# Patient Record
Sex: Female | Born: 1960 | Race: Black or African American | Hispanic: No | Marital: Single | Smoking: Current every day smoker
Health system: Southern US, Community
[De-identification: ages and names within clinical notes are randomized; demographics above are authoritative.]

## PROBLEM LIST (undated history)

## (undated) DIAGNOSIS — J45909 Unspecified asthma, uncomplicated: Secondary | ICD-10-CM

## (undated) DIAGNOSIS — I10 Essential (primary) hypertension: Secondary | ICD-10-CM

## (undated) DIAGNOSIS — E785 Hyperlipidemia, unspecified: Secondary | ICD-10-CM

## (undated) HISTORY — DX: Hyperlipidemia, unspecified: E78.5

## (undated) HISTORY — DX: Unspecified asthma, uncomplicated: J45.909

## (undated) HISTORY — DX: Essential (primary) hypertension: I10

## (undated) HISTORY — PX: NO PAST SURGERIES: SHX2092

---

## 2004-07-14 ENCOUNTER — Other Ambulatory Visit: Admission: RE | Admit: 2004-07-14 | Discharge: 2004-07-14 | Payer: Self-pay | Admitting: Family Medicine

## 2004-08-28 ENCOUNTER — Other Ambulatory Visit: Admission: RE | Admit: 2004-08-28 | Discharge: 2004-08-28 | Payer: Self-pay | Admitting: Family Medicine

## 2005-07-20 ENCOUNTER — Other Ambulatory Visit: Admission: RE | Admit: 2005-07-20 | Discharge: 2005-07-20 | Payer: Self-pay

## 2007-07-18 ENCOUNTER — Ambulatory Visit (HOSPITAL_COMMUNITY): Admission: RE | Admit: 2007-07-18 | Discharge: 2007-07-18 | Payer: Self-pay | Admitting: Family Medicine

## 2008-07-19 ENCOUNTER — Ambulatory Visit (HOSPITAL_COMMUNITY): Admission: RE | Admit: 2008-07-19 | Discharge: 2008-07-19 | Payer: Self-pay | Admitting: Family Medicine

## 2008-09-17 ENCOUNTER — Other Ambulatory Visit: Admission: RE | Admit: 2008-09-17 | Discharge: 2008-09-17 | Payer: Self-pay | Admitting: Unknown Physician Specialty

## 2009-07-22 ENCOUNTER — Ambulatory Visit (HOSPITAL_COMMUNITY): Admission: RE | Admit: 2009-07-22 | Discharge: 2009-07-22 | Payer: Self-pay | Admitting: Nurse Practitioner

## 2010-07-27 ENCOUNTER — Ambulatory Visit (HOSPITAL_COMMUNITY): Admission: RE | Admit: 2010-07-27 | Discharge: 2010-07-27 | Payer: Self-pay | Admitting: Family Medicine

## 2011-08-02 ENCOUNTER — Other Ambulatory Visit (HOSPITAL_COMMUNITY): Payer: Self-pay | Admitting: Family Medicine

## 2011-08-02 DIAGNOSIS — Z139 Encounter for screening, unspecified: Secondary | ICD-10-CM

## 2011-08-09 ENCOUNTER — Ambulatory Visit (HOSPITAL_COMMUNITY)
Admission: RE | Admit: 2011-08-09 | Discharge: 2011-08-09 | Disposition: A | Discharge status: Home / Self Care | Payer: Self-pay | Source: Ambulatory Visit | Attending: Family Medicine | Admitting: Family Medicine

## 2011-08-09 DIAGNOSIS — Z1231 Encounter for screening mammogram for malignant neoplasm of breast: Secondary | ICD-10-CM | POA: Insufficient documentation

## 2011-08-09 DIAGNOSIS — Z139 Encounter for screening, unspecified: Secondary | ICD-10-CM

## 2012-08-09 ENCOUNTER — Other Ambulatory Visit (HOSPITAL_COMMUNITY): Payer: Self-pay | Admitting: *Deleted

## 2012-08-09 DIAGNOSIS — Z139 Encounter for screening, unspecified: Secondary | ICD-10-CM

## 2012-08-17 ENCOUNTER — Ambulatory Visit (HOSPITAL_COMMUNITY)
Admission: RE | Admit: 2012-08-17 | Discharge: 2012-08-17 | Disposition: A | Discharge status: Home / Self Care | Payer: PRIVATE HEALTH INSURANCE | Source: Ambulatory Visit | Attending: *Deleted | Admitting: *Deleted

## 2012-08-17 DIAGNOSIS — Z139 Encounter for screening, unspecified: Secondary | ICD-10-CM

## 2012-08-17 DIAGNOSIS — Z1231 Encounter for screening mammogram for malignant neoplasm of breast: Secondary | ICD-10-CM | POA: Insufficient documentation

## 2013-03-23 ENCOUNTER — Ambulatory Visit: Payer: Self-pay | Admitting: Nurse Practitioner

## 2013-03-23 ENCOUNTER — Telehealth: Payer: Self-pay | Admitting: Physician Assistant

## 2013-03-23 NOTE — Telephone Encounter (Signed)
APPT MADE

## 2013-03-26 ENCOUNTER — Ambulatory Visit (INDEPENDENT_AMBULATORY_CARE_PROVIDER_SITE_OTHER): Payer: BC Managed Care – PPO | Admitting: General Practice

## 2013-03-26 ENCOUNTER — Telehealth: Payer: Self-pay | Admitting: Physician Assistant

## 2013-03-26 ENCOUNTER — Encounter: Payer: Self-pay | Admitting: General Practice

## 2013-03-26 VITALS — BP 164/86 | HR 79 | Temp 97.3°F | Ht 69.0 in | Wt 280.0 lb

## 2013-03-26 DIAGNOSIS — J322 Chronic ethmoidal sinusitis: Secondary | ICD-10-CM | POA: Insufficient documentation

## 2013-03-26 DIAGNOSIS — J069 Acute upper respiratory infection, unspecified: Secondary | ICD-10-CM | POA: Insufficient documentation

## 2013-03-26 DIAGNOSIS — R05 Cough: Secondary | ICD-10-CM

## 2013-03-26 DIAGNOSIS — J309 Allergic rhinitis, unspecified: Secondary | ICD-10-CM

## 2013-03-26 MED ORDER — AZITHROMYCIN 250 MG PO TABS: ORAL_TABLET | ORAL | Status: DC

## 2013-03-26 MED ORDER — CETIRIZINE HCL 10 MG PO TABS: 10.0000 mg | ORAL_TABLET | Freq: Every day | ORAL | Status: DC

## 2013-03-26 MED ORDER — GUAIFENESIN-CODEINE 100-10 MG/5ML PO SYRP: 5.0000 mL | ORAL_SOLUTION | Freq: Three times a day (TID) | ORAL | Status: DC | PRN

## 2013-03-26 MED ORDER — GUAIFENESIN-CODEINE 100-10 MG/5ML PO SYRP: 5.0000 mL | ORAL_SOLUTION | Freq: Every evening | ORAL | Status: DC | PRN

## 2013-03-26 NOTE — Progress Notes (Signed)
  Subjective:    Patient ID: Lauren Gardner, female    DOB: Dec 18, 1961, 52 y.o.   MRN: [id number]  Cough This is a new problem. The current episode started in the past 7 days. The problem has been gradually worsening. The problem occurs hourly. Cough characteristics: greensih color. Associated symptoms include headaches, nasal congestion, postnasal drip and rhinorrhea. Pertinent negatives include no chest pain, fever, rash or sore throat. Exacerbated by: climate "hot" She has tried prescription cough suppressant for the symptoms. There is no history of asthma or COPD.   Reports sinus pressure between eyes.   Review of Systems  Constitutional: Negative for fever.  HENT: Positive for rhinorrhea and postnasal drip. Negative for sore throat.   Respiratory: Positive for cough.   Cardiovascular: Negative for chest pain.  Skin: Negative for rash.  Neurological: Positive for headaches.       Objective:   Physical Exam  Constitutional: She is oriented to person, place, and time. She appears well-developed and well-nourished.  HENT:  Head: Normocephalic and atraumatic.  Right Ear: External ear normal.  Left Ear: External ear normal.  Nose: Rhinorrhea present. Right sinus exhibits maxillary sinus tenderness. Left sinus exhibits maxillary sinus tenderness.  Mouth/Throat: Posterior oropharyngeal erythema present.  Watery eyes, clear discharge sneezing  Eyes: Right eye exhibits no discharge.  Cardiovascular: Normal rate, regular rhythm and normal heart sounds.   No murmur heard. Pulmonary/Chest: Effort normal. No respiratory distress. She exhibits no tenderness.  Patient coughing, dry non productive   Neurological: She is alert and oriented to person, place, and time.  Skin: Skin is warm and dry.  Psychiatric: She has a normal mood and affect.          Assessment & Plan:  Continue antibiotics even if feeling better Increase fluid intake Motrin or tylenol OTC Throat lozenges if  help New toothbrush in 3 days Proper handwashing   Raymon Mutton, FNP-C

## 2013-03-26 NOTE — Telephone Encounter (Signed)
SEE ORIG. ENCOUNTER.

## 2013-03-26 NOTE — Telephone Encounter (Signed)
appt made for today 

## 2013-03-26 NOTE — Telephone Encounter (Signed)
PT WANTS MEDS CALLED INTO [facility] FOR SINUS INFECTION- CHART IN ROUTE.-JHB

## 2013-03-26 NOTE — Patient Instructions (Addendum)
Upper Respiratory Infection, Adult An upper respiratory infection (URI) is also sometimes known as the common cold. The upper respiratory tract includes the nose, sinuses, throat, trachea, and bronchi. Bronchi are the airways leading to the lungs. Most people improve within 1 week, but symptoms can last up to 2 weeks. A residual cough may last even longer.  CAUSES Many different viruses can infect the tissues lining the upper respiratory tract. The tissues become irritated and inflamed and often become very moist. Mucus production is also common. A cold is contagious. You can easily spread the virus to others by oral contact. This includes kissing, sharing a glass, coughing, or sneezing. Touching your mouth or nose and then touching a surface, which is then touched by another person, can also spread the virus. SYMPTOMS  Symptoms typically develop 1 to 3 days after you come in contact with a cold virus. Symptoms vary from person to person. They may include:  Runny nose.  Sneezing.  Nasal congestion.  Sinus irritation.  Sore throat.  Loss of voice (laryngitis).  Cough.  Fatigue.  Muscle aches.  Loss of appetite.  Headache.  Low-grade fever. DIAGNOSIS  You might diagnose your own cold based on familiar symptoms, since most people get a cold 2 to 3 times a year. Your caregiver can confirm this based on your exam. Most importantly, your caregiver can check that your symptoms are not due to another disease such as strep throat, sinusitis, pneumonia, asthma, or epiglottitis. Blood tests, throat tests, and X-rays are not necessary to diagnose a common cold, but they may sometimes be helpful in excluding other more serious diseases. Your caregiver will decide if any further tests are required. RISKS AND COMPLICATIONS  You may be at risk for a more severe case of the common cold if you smoke cigarettes, have chronic heart disease (such as heart failure) or lung disease (such as asthma), or if  you have a weakened immune system. The very young and very old are also at risk for more serious infections. Bacterial sinusitis, middle ear infections, and bacterial pneumonia can complicate the common cold. The common cold can worsen asthma and chronic obstructive pulmonary disease (COPD). Sometimes, these complications can require emergency medical care and may be life-threatening. PREVENTION  The best way to protect against getting a cold is to practice good hygiene. Avoid oral or hand contact with people with cold symptoms. Wash your hands often if contact occurs. There is no clear evidence that vitamin C, vitamin E, echinacea, or exercise reduces the chance of developing a cold. However, it is always recommended to get plenty of rest and practice good nutrition. TREATMENT  Treatment is directed at relieving symptoms. There is no cure. Antibiotics are not effective, because the infection is caused by a virus, not by bacteria. Treatment may include:  Increased fluid intake. Sports drinks offer valuable electrolytes, sugars, and fluids.  Breathing heated mist or steam (vaporizer or shower).  Eating chicken soup or other clear broths, and maintaining good nutrition.  Getting plenty of rest.  Using gargles or lozenges for comfort.  Controlling fevers with ibuprofen or acetaminophen as directed by your caregiver.  Increasing usage of your inhaler if you have asthma. Zinc gel and zinc lozenges, taken in the first 24 hours of the common cold, can shorten the duration and lessen the severity of symptoms. Pain medicines may help with fever, muscle aches, and throat pain. A variety of non-prescription medicines are available to treat congestion and runny nose. Your caregiver   duration and lessen the severity of symptoms. Pain medicines may help with fever, muscle aches, and throat pain. A variety of non-prescription medicines are available to treat congestion and runny nose. Your caregiver can make recommendations and may suggest nasal or lung inhalers for other symptoms.   HOME CARE INSTRUCTIONS    Only take over-the-counter or prescription medicines for pain, discomfort, or fever as directed by your  caregiver.   Use a warm mist humidifier or inhale steam from a shower to increase air moisture. This may keep secretions moist and make it easier to breathe.   Drink enough water and fluids to keep your urine clear or pale yellow.   Rest as needed.   Return to work when your temperature has returned to normal or as your caregiver advises. You may need to stay home longer to avoid infecting others. You can also use a face mask and careful hand washing to prevent spread of the virus.  SEEK MEDICAL CARE IF:    After the first few days, you feel you are getting worse rather than better.   You need your caregiver's advice about medicines to control symptoms.   You develop chills, worsening shortness of breath, or brown or red sputum. These may be signs of pneumonia.   You develop yellow or brown nasal discharge or pain in the face, especially when you bend forward. These may be signs of sinusitis.   You develop a fever, swollen neck glands, pain with swallowing, or white areas in the back of your throat. These may be signs of strep throat.  SEEK IMMEDIATE MEDICAL CARE IF:    You have a fever.   You develop severe or persistent headache, ear pain, sinus pain, or chest pain.   You develop wheezing, a prolonged cough, cough up blood, or have a change in your usual mucus (if you have chronic lung disease).   You develop sore muscles or a stiff neck.  Document Released: 06/01/2001 Document Revised: 02/28/2012 Document Reviewed: 04/09/2011  ExitCare Patient Information 2013 [employer].  Sinusitis  Sinusitis is redness, soreness, and swelling (inflammation) of the paranasal sinuses. Paranasal sinuses are air pockets within the bones of your face (beneath the eyes, the middle of the forehead, or above the eyes). In healthy paranasal sinuses, mucus is able to drain out, and air is able to circulate through them by way of your nose. However, when your paranasal sinuses are inflamed, mucus and air can become  trapped. This can allow bacteria and other germs to grow and cause infection.  Sinusitis can develop quickly and last only a short time (acute) or continue over a long period (chronic). Sinusitis that lasts for more than 12 weeks is considered chronic.   CAUSES   Causes of sinusitis include:   Allergies.   Structural abnormalities, such as displacement of the cartilage that separates your nostrils (deviated septum), which can decrease the air flow through your nose and sinuses and affect sinus drainage.   Functional abnormalities, such as when the small hairs (cilia) that line your sinuses and help remove mucus do not work properly or are not present.  SYMPTOMS   Symptoms of acute and chronic sinusitis are the same. The primary symptoms are pain and pressure around the affected sinuses. Other symptoms include:   Upper toothache.   Earache.   Headache.   Bad breath.   Decreased sense of smell and taste.   A cough, which worsens when you are lying flat.   Fatigue.     Fever.   Thick drainage from your nose, which often is green and may contain pus (purulent).   Swelling and warmth over the affected sinuses.  DIAGNOSIS   Your caregiver will perform a physical exam. During the exam, your caregiver may:   Look in your nose for signs of abnormal growths in your nostrils (nasal polyps).   Tap over the affected sinus to check for signs of infection.   View the inside of your sinuses (endoscopy) with a special imaging device with a light attached (endoscope), which is inserted into your sinuses.  If your caregiver suspects that you have chronic sinusitis, one or more of the following tests may be recommended:   Allergy tests.   Nasal culture A sample of mucus is taken from your nose and sent to a lab and screened for bacteria.   Nasal cytology A sample of mucus is taken from your nose and examined by your caregiver to determine if your sinusitis is related to an allergy.  TREATMENT   Most cases of acute  sinusitis are related to a viral infection and will resolve on their own within 10 days. Sometimes medicines are prescribed to help relieve symptoms (pain medicine, decongestants, nasal steroid sprays, or saline sprays).   However, for sinusitis related to a bacterial infection, your caregiver will prescribe antibiotic medicines. These are medicines that will help kill the bacteria causing the infection.   Rarely, sinusitis is caused by a fungal infection. In theses cases, your caregiver will prescribe antifungal medicine.  For some cases of chronic sinusitis, surgery is needed. Generally, these are cases in which sinusitis recurs more than 3 times per year, despite other treatments.  HOME CARE INSTRUCTIONS    Drink plenty of water. Water helps thin the mucus so your sinuses can drain more easily.   Use a humidifier.   Inhale steam 3 to 4 times a day (for example, sit in the bathroom with the shower running).   Apply a warm, moist washcloth to your face 3 to 4 times a day, or as directed by your caregiver.   Use saline nasal sprays to help moisten and clean your sinuses.   Take over-the-counter or prescription medicines for pain, discomfort, or fever only as directed by your caregiver.  SEEK IMMEDIATE MEDICAL CARE IF:   You have increasing pain or severe headaches.   You have nausea, vomiting, or drowsiness.   You have swelling around your face.   You have vision problems.   You have a stiff neck.   You have difficulty breathing.  MAKE SURE YOU:    Understand these instructions.   Will watch your condition.   Will get help right away if you are not doing well or get worse.  Document Released: 12/06/2005 Document Revised: 02/28/2012 Document Reviewed: 12/21/2011  [employer] Patient Information 2013 [employer].

## 2013-03-30 ENCOUNTER — Telehealth: Payer: Self-pay | Admitting: Nurse Practitioner

## 2013-04-02 NOTE — Telephone Encounter (Signed)
Pt aware.

## 2013-04-02 NOTE — Telephone Encounter (Signed)
Patient called stating that she still has not got the script from last week.

## 2013-04-02 NOTE — Telephone Encounter (Signed)
Does patient need something with codeine

## 2013-04-02 NOTE — Telephone Encounter (Signed)
JUST SOMETHING THAT WILL HELP WITH THE COUGH AND DRAINAGE

## 2013-04-02 NOTE — Telephone Encounter (Signed)
Mucinex D OTC will work best for that.

## 2013-07-06 ENCOUNTER — Encounter: Payer: Self-pay | Admitting: General Practice

## 2013-07-06 ENCOUNTER — Telehealth: Payer: Self-pay | Admitting: Physician Assistant

## 2013-07-06 ENCOUNTER — Encounter: Payer: BC Managed Care – PPO | Admitting: General Practice

## 2013-07-06 NOTE — Telephone Encounter (Signed)
appt made for today 

## 2013-07-06 NOTE — Progress Notes (Signed)
Patient ID: Lauren Gardner, female   DOB: 08-Aug-1961, 52 y.o.   MRN: [id number]  Patient reschedule for tomorrow.

## 2013-07-07 ENCOUNTER — Ambulatory Visit (INDEPENDENT_AMBULATORY_CARE_PROVIDER_SITE_OTHER): Payer: BC Managed Care – PPO | Admitting: Family Medicine

## 2013-07-07 VITALS — BP 109/75 | HR 93 | Temp 97.7°F | Wt 284.0 lb

## 2013-07-07 DIAGNOSIS — H1045 Other chronic allergic conjunctivitis: Secondary | ICD-10-CM

## 2013-07-07 DIAGNOSIS — H1010 Acute atopic conjunctivitis, unspecified eye: Secondary | ICD-10-CM | POA: Insufficient documentation

## 2013-07-07 DIAGNOSIS — E785 Hyperlipidemia, unspecified: Secondary | ICD-10-CM

## 2013-07-07 DIAGNOSIS — J329 Chronic sinusitis, unspecified: Secondary | ICD-10-CM | POA: Insufficient documentation

## 2013-07-07 DIAGNOSIS — J302 Other seasonal allergic rhinitis: Secondary | ICD-10-CM | POA: Insufficient documentation

## 2013-07-07 DIAGNOSIS — J309 Allergic rhinitis, unspecified: Secondary | ICD-10-CM

## 2013-07-07 MED ORDER — MOMETASONE FUROATE 50 MCG/ACT NA SUSP: 2.0000 | Freq: Every day | NASAL | Status: DC

## 2013-07-07 MED ORDER — CETIRIZINE HCL 10 MG PO TABS: 10.0000 mg | ORAL_TABLET | Freq: Every day | ORAL | Status: DC

## 2013-07-07 MED ORDER — AMOXICILLIN 875 MG PO TABS: 875.0000 mg | ORAL_TABLET | Freq: Two times a day (BID) | ORAL | Status: DC

## 2013-07-07 NOTE — Progress Notes (Signed)
Patient ID: Lauren Gardner, female   DOB: 06/17/1961, 52 y.o.   MRN: [id number] SUBJECTIVE: CC: Chief Complaint  Patient presents with  . Acute Visit    allergies stuffy nose cough     HPI: Has allergies and needs generic. Coughs at times. Facial pressure. Thinks  She needs an antibiotic.  Past Medical History  Diagnosis Date  . Asthma   . Hyperlipidemia   . Hypertension    No past surgical history on file. History   Social History  . Marital Status: Single    Spouse Name: N/A    Number of Children: N/A  . Years of Education: N/A   Occupational History  . Not on file.   Social History Main Topics  . Smoking status: Light Tobacco Smoker  . Smokeless tobacco: Not on file  . Alcohol Use: No  . Drug Use: No  . Sexually Active: Not on file   Other Topics Concern  . Not on file   Social History Narrative  . No narrative on file   Family History  Problem Relation Age of Onset  . Hyperlipidemia Mother   . Cancer Sister   . Hypertension Sister    Current Outpatient Prescriptions on File Prior to Visit  Medication Sig Dispense Refill  . hydrochlorothiazide (HYDRODIURIL) 25 MG tablet Take 25 mg by mouth daily.      . Norethindrone (ORTHO MICRONOR PO) Take by mouth.      . pravastatin (PRAVACHOL) 20 MG tablet Take 20 mg by mouth daily.       No current facility-administered medications on file prior to visit.   Allergies  Allergen Reactions  . Prednisolone     There is no immunization history on file for this patient. Prior to Admission medications   Medication Sig Start Date End Date Taking? Authorizing Provider  hydrochlorothiazide (HYDRODIURIL) 25 MG tablet Take 25 mg by mouth daily.   Yes Historical Provider, MD  Norethindrone (ORTHO MICRONOR PO) Take by mouth.   Yes Historical Provider, MD  pravastatin (PRAVACHOL) 20 MG tablet Take 20 mg by mouth daily.   Yes Historical Provider, MD  amoxicillin (AMOXIL) 875 MG tablet Take 1 tablet (875 mg total) by mouth  2 (two) times daily. 07/07/13   Ileana Ladd, MD  cetirizine (ZYRTEC) 10 MG tablet Take 1 tablet (10 mg total) by mouth daily. 07/07/13   Ileana Ladd, MD  mometasone (NASONEX) 50 MCG/ACT nasal spray Place 2 sprays into the nose daily. 07/07/13   Ileana Ladd, MD     ROS: As above in the HPI. All other systems are stable or negative.  OBJECTIVE: APPEARANCE:  Patient in no acute distress.The patient appeared well nourished and normally developed. Acyanotic. Waist:48 inches  VITAL SIGNS:BP 109/75  Pulse 93  Temp(Src) 97.7 F (36.5 C) (Oral)  Wt 284 lb (128.822 kg)  BMI 41.92 kg/m2 Obese AAF  SKIN: warm and  Dry without overt rashes, tattoos and scars  HEAD and Neck: without JVD, Head and scalp: normal Eyes:No scleral icterus. Fundi normal, eye movements normal.left upper eyelid has a xanthelasma plaque Ears: Auricle normal, canal normal, Tympanic membranes normal, insufflation normal. Nose: nasal congestion and paranasal pressure,  Throat: normal Neck & thyroid: normal  CHEST & LUNGS: Chest wall: normal Lungs: Clear  [facility]: Reveals the PMI to be normally located. Regular rhythm, First and Second Heart sounds are normal,  absence of murmurs, rubs or gallops. Peripheral vasculature: Radial pulses: normal Dorsal pedis pulses: normal Posterior pulses: normal  ABDOMEN:  Appearance: ob Benign, no organomegaly, no masses, no Abdominal Aortic enlargement. No Guarding , no rebound. No Bruits. Bowel sounds: normal   ASSESSMENT: Seasonal allergic conjunctivitis  Seasonal allergic rhinitis - Plan: mometasone (NASONEX) 50 MCG/ACT nasal spray, cetirizine (ZYRTEC) 10 MG tablet  Sinusitis nasal - Plan: mometasone (NASONEX) 50 MCG/ACT nasal spray, amoxicillin (AMOXIL) 875 MG tablet, cetirizine (ZYRTEC) 10 MG tablet  Severe obesity (BMI >= 40)  HLD (hyperlipidemia)  PLAN:      Dr Woodroe Mode Recommendations  Diet and Exercise discussed with patient.  For  nutrition information, I recommend books:  1).Eat to Live by Dr Monico Hoar. 2).Prevent and Reverse Heart Disease by Dr Suzzette Righter. 3) Dr Katherina Right Book:  Program to Reverse Diabetes  Exercise recommendations are:  If unable to walk, then the patient can exercise in a chair 3 times a day. By flapping arms like a bird gently and raising legs outwards to the front.  If ambulatory, the patient can go for walks for 30 minutes 3 times a week. Then increase the intensity and duration as tolerated.  Goal is to try to attain exercise frequency to 5 times a week.  If applicable: Best to perform resistance exercises (machines or weights) 2 days a week and cardio type exercises 3 days per week.  Meds ordered this encounter  Medications  . mometasone (NASONEX) 50 MCG/ACT nasal spray    Sig: Place 2 sprays into the nose daily.    Dispense:  17 g    Refill:  3  . amoxicillin (AMOXIL) 875 MG tablet    Sig: Take 1 tablet (875 mg total) by mouth 2 (two) times daily.    Dispense:  20 tablet    Refill:  0  . cetirizine (ZYRTEC) 10 MG tablet    Sig: Take 1 tablet (10 mg total) by mouth daily.    Dispense:  30 tablet    Refill:  5   Return if symptoms worsen or fail to improve.  [name], M.D.

## 2013-07-07 NOTE — Patient Instructions (Addendum)
      Dr Dempsey Ahonen's Recommendations  Diet and Exercise discussed with patient.  For nutrition information, I recommend books:  1).Eat to Live by Dr Joel Fuhrman. 2).Prevent and Reverse Heart Disease by Dr Caldwell Esselstyn. 3) Dr Neal Barnard's Book:  Program to Reverse Diabetes  Exercise recommendations are:  If unable to walk, then the patient can exercise in a chair 3 times a day. By flapping arms like a bird gently and raising legs outwards to the front.  If ambulatory, the patient can go for walks for 30 minutes 3 times a week. Then increase the intensity and duration as tolerated.  Goal is to try to attain exercise frequency to 5 times a week.  If applicable: Best to perform resistance exercises (machines or weights) 2 days a week and cardio type exercises 3 days per week.  

## 2013-07-21 ENCOUNTER — Telehealth: Payer: Self-pay | Admitting: Family Medicine

## 2013-07-23 ENCOUNTER — Other Ambulatory Visit: Payer: Self-pay | Admitting: Family Medicine

## 2013-07-23 DIAGNOSIS — B3731 Acute candidiasis of vulva and vagina: Secondary | ICD-10-CM

## 2013-07-23 DIAGNOSIS — B373 Candidiasis of vulva and vagina: Secondary | ICD-10-CM

## 2013-07-23 MED ORDER — FLUCONAZOLE 150 MG PO TABS: 150.0000 mg | ORAL_TABLET | Freq: Once | ORAL | Status: DC

## 2013-07-23 NOTE — Telephone Encounter (Signed)
Pt aware rx called in.  

## 2013-07-23 NOTE — Telephone Encounter (Signed)
Done in EPIC. Diflucan Rx.

## 2013-07-31 ENCOUNTER — Other Ambulatory Visit: Payer: Self-pay | Admitting: Nurse Practitioner

## 2013-07-31 DIAGNOSIS — Z139 Encounter for screening, unspecified: Secondary | ICD-10-CM

## 2013-08-13 ENCOUNTER — Telehealth: Payer: Self-pay | Admitting: Nurse Practitioner

## 2013-08-14 ENCOUNTER — Telehealth: Payer: Self-pay | Admitting: Family Medicine

## 2013-08-15 MED ORDER — HYDROCHLOROTHIAZIDE 25 MG PO TABS: 25.0000 mg | ORAL_TABLET | Freq: Every day | ORAL | Status: DC

## 2013-08-15 NOTE — Telephone Encounter (Signed)
done

## 2013-08-17 MED ORDER — NORETHINDRONE 0.35 MG PO TABS: 1.0000 | ORAL_TABLET | Freq: Every day | ORAL | Status: DC

## 2013-08-17 NOTE — Telephone Encounter (Signed)
done

## 2013-08-21 ENCOUNTER — Encounter: Payer: Self-pay | Admitting: *Deleted

## 2013-08-21 ENCOUNTER — Telehealth: Payer: Self-pay | Admitting: Family Medicine

## 2013-08-21 NOTE — Telephone Encounter (Signed)
Spoke with pt she was concerned about her birth control med not being the same. Pt reasasured that the med that ordered on 08-17-13 notethidone is the same as orthor micronette. Confirmed by [employer]

## 2013-08-23 ENCOUNTER — Ambulatory Visit (HOSPITAL_COMMUNITY)
Admission: RE | Admit: 2013-08-23 | Discharge: 2013-08-23 | Disposition: A | Discharge status: Home / Self Care | Payer: BC Managed Care – PPO | Source: Ambulatory Visit | Attending: Nurse Practitioner | Admitting: Nurse Practitioner

## 2013-08-23 DIAGNOSIS — Z1231 Encounter for screening mammogram for malignant neoplasm of breast: Secondary | ICD-10-CM | POA: Insufficient documentation

## 2013-08-23 DIAGNOSIS — Z139 Encounter for screening, unspecified: Secondary | ICD-10-CM

## 2013-08-27 ENCOUNTER — Encounter: Payer: Self-pay | Admitting: General Practice

## 2013-08-27 ENCOUNTER — Ambulatory Visit (INDEPENDENT_AMBULATORY_CARE_PROVIDER_SITE_OTHER): Payer: BC Managed Care – PPO | Admitting: General Practice

## 2013-08-27 VITALS — BP 116/76 | HR 79 | Temp 98.9°F | Ht 67.0 in | Wt 294.0 lb

## 2013-08-27 DIAGNOSIS — I10 Essential (primary) hypertension: Secondary | ICD-10-CM

## 2013-08-27 DIAGNOSIS — J302 Other seasonal allergic rhinitis: Secondary | ICD-10-CM

## 2013-08-27 DIAGNOSIS — Z0000 Encounter for general adult medical examination without abnormal findings: Secondary | ICD-10-CM

## 2013-08-27 DIAGNOSIS — Z124 Encounter for screening for malignant neoplasm of cervix: Secondary | ICD-10-CM

## 2013-08-27 DIAGNOSIS — Z309 Encounter for contraceptive management, unspecified: Secondary | ICD-10-CM

## 2013-08-27 DIAGNOSIS — J329 Chronic sinusitis, unspecified: Secondary | ICD-10-CM

## 2013-08-27 DIAGNOSIS — E785 Hyperlipidemia, unspecified: Secondary | ICD-10-CM

## 2013-08-27 DIAGNOSIS — IMO0001 Reserved for inherently not codable concepts without codable children: Secondary | ICD-10-CM

## 2013-08-27 DIAGNOSIS — M1990 Unspecified osteoarthritis, unspecified site: Secondary | ICD-10-CM

## 2013-08-27 LAB — POCT URINALYSIS DIPSTICK
Bilirubin, UA: NEGATIVE
Ketones, UA: NEGATIVE
Leukocytes, UA: NEGATIVE
Nitrite, UA: NEGATIVE
Spec Grav, UA: 1.025

## 2013-08-27 LAB — POCT UA - MICROSCOPIC ONLY
Casts, Ur, LPF, POC: NEGATIVE
Mucus, UA: NEGATIVE
Yeast, UA: NEGATIVE

## 2013-08-27 LAB — POCT CBC
HCT, POC: 40.1 % (ref 37.7–47.9)
Hemoglobin: 13.3 g/dL (ref 12.2–16.2)
MCV: 87.6 fL (ref 80–97)
POC Granulocyte: 6.8 (ref 2–6.9)
POC LYMPH PERCENT: 28.1 %L (ref 10–50)
RBC: 4.6 M/uL (ref 4.04–5.48)

## 2013-08-27 MED ORDER — PRAVASTATIN SODIUM 20 MG PO TABS: 20.0000 mg | ORAL_TABLET | Freq: Every day | ORAL | Status: DC

## 2013-08-27 MED ORDER — MELOXICAM 15 MG PO TABS
15.0000 mg | ORAL_TABLET | Freq: Every day | ORAL | Status: DC
Start: 2013-08-27 — End: 2014-09-24

## 2013-08-27 MED ORDER — NORETHINDRONE 0.35 MG PO TABS: 1.0000 | ORAL_TABLET | Freq: Every day | ORAL | Status: DC

## 2013-08-27 MED ORDER — MOMETASONE FUROATE 50 MCG/ACT NA SUSP: 2.0000 | Freq: Every day | NASAL | Status: DC

## 2013-08-27 MED ORDER — HYDROCHLOROTHIAZIDE 25 MG PO TABS: 25.0000 mg | ORAL_TABLET | Freq: Every day | ORAL | Status: DC

## 2013-08-27 NOTE — Patient Instructions (Signed)

## 2013-08-27 NOTE — Progress Notes (Signed)
Subjective:    Patient ID: Lauren Gardner, female    DOB: 05/08/1961, 52 y.o.   MRN: [id number]  HPI Patient presents today for pap and annual physical. Reports trying to eat healthy, but denies regular exercise.     Review of Systems  Constitutional: Negative for fever and chills.  HENT: Negative for neck pain and neck stiffness.   Respiratory: Negative for chest tightness and shortness of breath.   Cardiovascular: Negative for chest pain and palpitations.  Gastrointestinal: Negative for nausea, vomiting and abdominal pain.  Genitourinary: Negative for difficulty urinating.  Musculoskeletal: Positive for back pain.       Back pain periodically from arthritis and possible pinched nerve  Neurological: Negative for dizziness, weakness and headaches.       Objective:   Physical Exam  Constitutional: She is oriented to person, place, and time. She appears well-developed and well-nourished.  HENT:  Head: Normocephalic and atraumatic.  Right Ear: External ear normal.  Left Ear: External ear normal.  Mouth/Throat: Oropharynx is clear and moist.  Eyes: Conjunctivae and EOM are normal. Pupils are equal, round, and reactive to light.  Neck: Normal range of motion. Neck supple. No thyromegaly present.  Cardiovascular: Normal rate, regular rhythm, normal heart sounds and intact distal pulses.   Pulmonary/Chest: Effort normal and breath sounds normal. No respiratory distress. Right breast exhibits no inverted nipple, no mass, no nipple discharge, no skin change and no tenderness. Left breast exhibits no inverted nipple, no mass, no nipple discharge, no skin change and no tenderness. Breasts are symmetrical.  Abdominal: Soft. Bowel sounds are normal. She exhibits no distension.  Genitourinary: Vagina normal and uterus normal. No breast swelling, tenderness, discharge or bleeding. No labial fusion. There is no rash, tenderness, lesion or injury on the right labia. There is no rash, tenderness,  lesion or injury on the left labia. Uterus is not deviated, not enlarged, not fixed and not tender. Cervix exhibits no motion tenderness, no discharge and no friability. Right adnexum displays no mass, no tenderness and no fullness. Left adnexum displays no mass, no tenderness and no fullness. No erythema, tenderness or bleeding around the vagina. No foreign body around the vagina. No signs of injury around the vagina. No vaginal discharge found.  Musculoskeletal:  Back pain peridically  Lymphadenopathy:    She has no cervical adenopathy.  Neurological: She is alert and oriented to person, place, and time.  Skin: Skin is warm and dry.  Psychiatric: She has a normal mood and affect.          Assessment & Plan:  1. Annual physical exam - POCT urinalysis dipstick - POCT UA - Microscopic Only - POCT CBC - Pap IG w/ reflex to HPV when ASC-U  2. Hypertension - CMP14+EGFR - hydrochlorothiazide (HYDRODIURIL) 25 MG tablet; Take 1 tablet (25 mg total) by mouth daily.  Dispense: 30 tablet; Refill: 3  3. Other and unspecified hyperlipidemia - NMR, lipoprofile - pravastatin (PRAVACHOL) 20 MG tablet; Take 1 tablet (20 mg total) by mouth daily.  Dispense: 30 tablet; Refill: 3  4. Seasonal allergic rhinitis - mometasone (NASONEX) 50 MCG/ACT nasal spray; Place 2 sprays into the nose daily.  Dispense: 17 g; Refill: 3  5. Sinusitis nasal  6. Birth control - norethindrone (ORTHO MICRONOR) 0.35 MG tablet; Take 1 tablet (0.35 mg total) by mouth daily.  Dispense: 1 Package; Refill: 11  7. Osteoarthritis - meloxicam (MOBIC) 15 MG tablet; Take 1 tablet (15 mg total) by mouth daily.  Dispense: 30  tablet; Refill: 3 -Continue all current medications Labs pending RTO if symptoms worsen with back pain and will refer to ortho  F/u in 3 months and prn Discussed healthy diet and regular exercise -Patient verbalized understanding -Coralie Keens, FNP-C

## 2013-08-28 LAB — NMR, LIPOPROFILE
Cholesterol: 206 mg/dL — ABNORMAL HIGH (ref <200)
HDL Cholesterol by NMR: 43 mg/dL (ref >=40)
LDL Particle Number: 2191 nmol/L — ABNORMAL HIGH (ref <1000)
LDL Size: 20.2 nm — ABNORMAL LOW (ref >20.5)
LP-IR Score: 69 — ABNORMAL HIGH (ref <=45)
Small LDL Particle Number: 1401 nmol/L — ABNORMAL HIGH (ref <=527)

## 2013-08-28 LAB — CMP14+EGFR
ALT: 17 IU/L (ref 0–32)
AST: 15 IU/L (ref 0–40)
Albumin/Globulin Ratio: 1.8 (ref 1.1–2.5)
Albumin: 4.2 g/dL (ref 3.5–5.5)
CO2: 26 mmol/L (ref 18–29)
GFR calc Af Amer: 107 mL/min/{1.73_m2} (ref >59)
Globulin, Total: 2.4 g/dL (ref 1.5–4.5)
Glucose: 69 mg/dL (ref 65–99)
Total Protein: 6.6 g/dL (ref 6.0–8.5)

## 2013-08-29 LAB — PAP IG W/ RFLX HPV ASCU: PAP Smear Comment: 0

## 2013-08-30 ENCOUNTER — Telehealth: Payer: Self-pay | Admitting: General Practice

## 2013-08-30 NOTE — Telephone Encounter (Signed)
Patient aware.

## 2013-09-03 ENCOUNTER — Telehealth: Payer: Self-pay | Admitting: General Practice

## 2013-09-04 NOTE — Telephone Encounter (Signed)
Patient aware.

## 2013-09-18 ENCOUNTER — Other Ambulatory Visit: Payer: Self-pay | Admitting: Family Medicine

## 2013-09-25 ENCOUNTER — Other Ambulatory Visit: Payer: BC Managed Care – PPO | Admitting: Nurse Practitioner

## 2013-10-12 ENCOUNTER — Other Ambulatory Visit: Payer: Self-pay | Admitting: Family Medicine

## 2013-10-25 ENCOUNTER — Other Ambulatory Visit: Payer: Self-pay | Admitting: Family Medicine

## 2013-10-25 ENCOUNTER — Telehealth: Payer: Self-pay | Admitting: General Practice

## 2013-11-08 ENCOUNTER — Telehealth: Payer: Self-pay | Admitting: General Practice

## 2013-11-09 NOTE — Telephone Encounter (Signed)
Triage will schedule.

## 2013-11-09 NOTE — Telephone Encounter (Signed)
Needs to be seen for evaluation

## 2014-01-09 ENCOUNTER — Encounter: Payer: Self-pay | Admitting: General Practice

## 2014-01-09 ENCOUNTER — Ambulatory Visit (INDEPENDENT_AMBULATORY_CARE_PROVIDER_SITE_OTHER): Payer: BC Managed Care – PPO | Admitting: General Practice

## 2014-01-09 ENCOUNTER — Encounter (INDEPENDENT_AMBULATORY_CARE_PROVIDER_SITE_OTHER): Payer: Self-pay

## 2014-01-09 VITALS — BP 122/71 | HR 81 | Temp 97.8°F | Ht 67.0 in | Wt 312.0 lb

## 2014-01-09 DIAGNOSIS — R05 Cough: Secondary | ICD-10-CM

## 2014-01-09 DIAGNOSIS — R059 Cough, unspecified: Secondary | ICD-10-CM

## 2014-01-09 DIAGNOSIS — J019 Acute sinusitis, unspecified: Secondary | ICD-10-CM

## 2014-01-09 MED ORDER — BENZONATATE 100 MG PO CAPS: 100.0000 mg | ORAL_CAPSULE | Freq: Three times a day (TID) | ORAL | Status: DC | PRN

## 2014-01-09 MED ORDER — AZITHROMYCIN 250 MG PO TABS: ORAL_TABLET | ORAL | Status: DC

## 2014-01-09 NOTE — Progress Notes (Signed)
   Subjective:    Patient ID: Lauren Gardner, female    DOB: June 07, 1961, 53 y.o.   MRN: [id number]  Sinusitis This is a new problem. The current episode started in the past 7 days. The problem is unchanged. There has been no fever. Her pain is at a severity of 4/10. Associated symptoms include congestion, coughing, a hoarse voice and sinus pressure. Pertinent negatives include no chills, headaches, shortness of breath or sore throat. Past treatments include lying down. The treatment provided mild relief.  Cough This is a new problem. The current episode started in the past 7 days. The problem has been unchanged. The cough is non-productive. Pertinent negatives include no chest pain, chills, headaches, sore throat or shortness of breath. The symptoms are aggravated by lying down. Treatments tried: allergy medicine. There is no history of asthma, bronchitis or pneumonia.      Review of Systems  Constitutional: Negative for chills.  HENT: Positive for congestion, hoarse voice and sinus pressure. Negative for sore throat.   Respiratory: Positive for cough. Negative for chest tightness and shortness of breath.   Cardiovascular: Negative for chest pain and palpitations.  Neurological: Negative for dizziness, weakness and headaches.  All other systems reviewed and are negative.       Objective:   Physical Exam  Constitutional: She appears well-developed and well-nourished.  HENT:  Head: Normocephalic and atraumatic.  Nose: Right sinus exhibits maxillary sinus tenderness and frontal sinus tenderness. Left sinus exhibits maxillary sinus tenderness and frontal sinus tenderness.  Mouth/Throat: Posterior oropharyngeal erythema present.  Cardiovascular: Normal rate, regular rhythm and normal heart sounds.   No murmur heard. Pulmonary/Chest: Effort normal and breath sounds normal. No respiratory distress. She exhibits no tenderness.  Neurological: She is alert.  Skin: Skin is warm and dry. No  rash noted.  Psychiatric: She has a normal mood and affect.          Assessment & Plan:  1. Sinusitis, acute  - azithromycin (ZITHROMAX) 250 MG tablet; Take as directed  Dispense: 6 tablet; Refill: 0  2. Cough  - benzonatate (TESSALON) 100 MG capsule; Take 1 capsule (100 mg total) by mouth 3 (three) times daily as needed.  Dispense: 30 capsule; Refill: 0 -increase fluids -rest -RTO if symptoms worsen or unresolved Patient verbalized understanding Erby Pian, FNP-C

## 2014-01-09 NOTE — Patient Instructions (Signed)

## 2014-01-10 ENCOUNTER — Other Ambulatory Visit: Payer: Self-pay | Admitting: *Deleted

## 2014-01-10 DIAGNOSIS — E785 Hyperlipidemia, unspecified: Secondary | ICD-10-CM

## 2014-01-10 MED ORDER — PRAVASTATIN SODIUM 20 MG PO TABS: 20.0000 mg | ORAL_TABLET | Freq: Every day | ORAL | Status: DC

## 2014-01-17 ENCOUNTER — Other Ambulatory Visit: Payer: Self-pay | Admitting: General Practice

## 2014-01-17 ENCOUNTER — Telehealth: Payer: Self-pay | Admitting: General Practice

## 2014-01-17 DIAGNOSIS — R059 Cough, unspecified: Secondary | ICD-10-CM

## 2014-01-17 DIAGNOSIS — R05 Cough: Secondary | ICD-10-CM

## 2014-01-17 MED ORDER — GUAIFENESIN-CODEINE 100-10 MG/5ML PO SYRP: 5.0000 mL | ORAL_SOLUTION | Freq: Three times a day (TID) | ORAL | Status: DC | PRN

## 2014-01-17 NOTE — Telephone Encounter (Signed)
Please inform that script is ready for pick up.

## 2014-01-17 NOTE — Telephone Encounter (Signed)
Need something stronger for cough. Mae off today. Can you review please. Can you call to [place]?

## 2014-01-18 NOTE — Telephone Encounter (Signed)
Left message Rx is ready to be picked up

## 2014-03-07 ENCOUNTER — Other Ambulatory Visit: Payer: Self-pay | Admitting: *Deleted

## 2014-03-07 DIAGNOSIS — I10 Essential (primary) hypertension: Secondary | ICD-10-CM

## 2014-03-07 MED ORDER — HYDROCHLOROTHIAZIDE 25 MG PO TABS: 25.0000 mg | ORAL_TABLET | Freq: Every day | ORAL | Status: DC

## 2014-04-08 ENCOUNTER — Telehealth: Payer: Self-pay | Admitting: Nurse Practitioner

## 2014-04-08 NOTE — Telephone Encounter (Signed)
patient aware

## 2014-04-08 NOTE — Telephone Encounter (Signed)
mucine OTC and nettie pot

## 2014-04-09 ENCOUNTER — Other Ambulatory Visit: Payer: Self-pay | Admitting: General Practice

## 2014-04-12 ENCOUNTER — Encounter: Payer: Self-pay | Admitting: Family Medicine

## 2014-04-12 ENCOUNTER — Ambulatory Visit (INDEPENDENT_AMBULATORY_CARE_PROVIDER_SITE_OTHER): Payer: BC Managed Care – PPO | Admitting: Family Medicine

## 2014-04-12 VITALS — BP 114/69 | HR 77 | Temp 97.4°F | Ht 67.0 in | Wt 305.6 lb

## 2014-04-12 DIAGNOSIS — J329 Chronic sinusitis, unspecified: Secondary | ICD-10-CM

## 2014-04-12 MED ORDER — AMOXICILLIN 875 MG PO TABS: 875.0000 mg | ORAL_TABLET | Freq: Two times a day (BID) | ORAL | Status: DC

## 2014-04-12 MED ORDER — BENZONATATE 100 MG PO CAPS: 100.0000 mg | ORAL_CAPSULE | Freq: Three times a day (TID) | ORAL | Status: DC | PRN

## 2014-04-12 NOTE — Progress Notes (Signed)
   Subjective:    Patient ID: Lauren Gardner, female    DOB: 11-Dec-1961, 53 y.o.   MRN: [id number]  HPI  This 53 y.o. female presents for evaluation of URI sx's for over a week.  Review of Systems    No chest pain, SOB, HA, dizziness, vision change, N/V, diarrhea, constipation, dysuria, urinary urgency or frequency, myalgias, arthralgias or rash.  Objective:   Physical Exam  Vital signs noted  Well developed well nourished female.  HEENT - Head atraumatic Normocephalic                Eyes - PERRLA, Conjuctiva - clear Sclera- Clear EOMI                Ears - EAC's Wnl TM's Wnl Gross Hearing WNL                Nose - Nares patent                 Throat - oropharanx wnl Respiratory - Lungs CTA bilateral Cardiac - RRR S1 and S2 without murmur GI - Abdomen soft Nontender and bowel sounds active x 4 Extremities - No edema. Neuro - Grossly intact.      Assessment & Plan:  Sinusitis nasal - Plan: benzonatate (TESSALON PERLES) 100 MG capsule, amoxicillin (AMOXIL) 875 MG tablet Push po fluids, rest, tylenol and motrin otc prn as directed for fever, arthralgias, and myalgias.  Follow up prn if sx's continue or persist.  Lysbeth Penner FNP

## 2014-04-15 ENCOUNTER — Telehealth: Payer: Self-pay | Admitting: Family Medicine

## 2014-04-16 NOTE — Telephone Encounter (Signed)
Pt seen on 4/24 Given Amoxil and Tessalon Perles Wants stronger cough med  Please review and advise

## 2014-04-17 ENCOUNTER — Other Ambulatory Visit: Payer: Self-pay | Admitting: Family Medicine

## 2014-04-17 NOTE — Telephone Encounter (Signed)
NTBS for narcotic cough medicine

## 2014-04-18 ENCOUNTER — Telehealth: Payer: Self-pay | Admitting: Family Medicine

## 2014-04-18 DIAGNOSIS — B379 Candidiasis, unspecified: Secondary | ICD-10-CM

## 2014-04-18 NOTE — Telephone Encounter (Signed)
Left detailed message on voicemail that she NTBS for narcotic cough medicine so that we can evaluate the worsening of her symptoms. The original message didn't contain request for Diflucan. Bill isn't here to address this today.  She can use OTC generic Monistat. The 3 day ovule works well. She is to call back if she needs a prescription.

## 2014-04-19 MED ORDER — FLUCONAZOLE 150 MG PO TABS: 150.0000 mg | ORAL_TABLET | Freq: Once | ORAL | Status: DC

## 2014-04-19 NOTE — Telephone Encounter (Signed)
Spoke with pt Has yeast infection from antibiotic Rx for Diflucan ordered by Dietrich Pates Left message on pt's voicemail

## 2014-04-22 ENCOUNTER — Ambulatory Visit (INDEPENDENT_AMBULATORY_CARE_PROVIDER_SITE_OTHER): Payer: BC Managed Care – PPO | Admitting: Family

## 2014-04-22 ENCOUNTER — Telehealth: Payer: Self-pay | Admitting: Family Medicine

## 2014-04-22 ENCOUNTER — Encounter: Payer: Self-pay | Admitting: Family

## 2014-04-22 VITALS — BP 124/73 | HR 89 | Temp 99.3°F | Ht 68.0 in | Wt 308.2 lb

## 2014-04-22 DIAGNOSIS — J019 Acute sinusitis, unspecified: Secondary | ICD-10-CM

## 2014-04-22 MED ORDER — AMOXICILLIN-POT CLAVULANATE 875-125 MG PO TABS: 1.0000 | ORAL_TABLET | Freq: Two times a day (BID) | ORAL | Status: DC

## 2014-04-22 MED ORDER — HYDROCODONE-HOMATROPINE 5-1.5 MG/5ML PO SYRP: 5.0000 mL | ORAL_SOLUTION | Freq: Four times a day (QID) | ORAL | Status: DC | PRN

## 2014-04-22 NOTE — Telephone Encounter (Signed)
error 

## 2014-04-22 NOTE — Patient Instructions (Signed)

## 2014-04-22 NOTE — Progress Notes (Signed)
   Subjective:    Patient ID: Lauren Gardner, female    DOB: 1961/05/22, 53 y.o.   MRN: [id number]  Sinusitis This is a recurrent problem. The current episode started 1 to 4 weeks ago (two weeks ago). The problem has been gradually worsening since onset. There has been no fever. Her pain is at a severity of 10/10. The pain is moderate. Associated symptoms include congestion, sinus pressure and sneezing. Pertinent negatives include no sore throat. Past treatments include antibiotics, acetaminophen and oral decongestants. The treatment provided mild relief.      Review of Systems  HENT: Positive for congestion, sinus pressure and sneezing. Negative for sore throat.   All other systems reviewed and are negative.      Objective:   Physical Exam  Vitals reviewed. Constitutional: She is oriented to person, place, and time. She appears well-developed and well-nourished. No distress.  HENT:  Head: Normocephalic and atraumatic.  Right Ear: External ear normal.  Left Ear: External ear normal.  Mouth/Throat: Oropharynx is clear and moist.  Nasal passage erythema with mild swelling   Eyes: Pupils are equal, round, and reactive to light.  Neck: Normal range of motion. Neck supple. No thyromegaly present.  Cardiovascular: Normal rate, regular rhythm, normal heart sounds and intact distal pulses.   No murmur heard. Pulmonary/Chest: Effort normal. No respiratory distress. She has wheezes.  Musculoskeletal: Normal range of motion. She exhibits no edema and no tenderness.  Neurological: She is alert and oriented to person, place, and time.  Skin: Skin is warm and dry.  Psychiatric: She has a normal mood and affect. Her behavior is normal. Judgment and thought content normal.    BP 124/73  Pulse 89  Temp(Src) 99.3 F (37.4 C) (Oral)  Ht 5\' 8"  (1.727 m)  Wt 308 lb 3.2 oz (139.799 kg)  BMI 46.87 kg/m2       Assessment & Plan:  1. Acute sinusitis -- Take meds as prescribed - Use a cool  mist humidifier  -Use saline nose sprays frequently -Saline irrigations of the nose can be very helpful if done frequently.  * 4X daily for 1 week*  * Use of a nettie pot can be helpful with this. Follow directions with this* -Force fluids -For any cough or congestion  Use plain Mucinex- regular strength or max strength is fine -For fever or aces or pains- take tylenol or ibuprofen appropriate for age and weight.  * for fevers greater than 101 orally you may alternate ibuprofen and tylenol every  3 hours. -Throat lozenges if help -Cough syprup given- Educated pt that may cause drowsiness-Use Tessalon pearls during day and syrupy at bedtime -RTO prn- Call if not symptoms have not improved in 3 days and will call new antibotic     - amoxicillin-clavulanate (AUGMENTIN) 875-125 MG per tablet; Take 1 tablet by mouth 2 (two) times daily.  Dispense: 28 tablet; Refill: 0 - HYDROcodone-homatropine (HYCODAN) 5-1.5 MG/5ML syrup; Take 5 mLs by mouth every 6 (six) hours as needed for cough (May cause drowiness).  Dispense: 120 mL; Refill: 0  Evelina Dun, FNP

## 2014-05-12 ENCOUNTER — Other Ambulatory Visit: Payer: Self-pay | Admitting: *Deleted

## 2014-05-12 DIAGNOSIS — J329 Chronic sinusitis, unspecified: Secondary | ICD-10-CM

## 2014-05-12 DIAGNOSIS — J302 Other seasonal allergic rhinitis: Secondary | ICD-10-CM

## 2014-05-12 MED ORDER — CETIRIZINE HCL 10 MG PO TABS: 10.0000 mg | ORAL_TABLET | Freq: Every day | ORAL | Status: DC

## 2014-05-13 ENCOUNTER — Other Ambulatory Visit: Payer: Self-pay | Admitting: *Deleted

## 2014-05-13 DIAGNOSIS — E785 Hyperlipidemia, unspecified: Secondary | ICD-10-CM

## 2014-05-13 NOTE — Telephone Encounter (Signed)
No labs since 08/2013

## 2014-05-14 MED ORDER — PRAVASTATIN SODIUM 20 MG PO TABS: 20.0000 mg | ORAL_TABLET | Freq: Every day | ORAL | Status: DC

## 2014-05-14 NOTE — Telephone Encounter (Signed)
Patient NTBS for follow up and lab work  

## 2014-06-18 ENCOUNTER — Other Ambulatory Visit: Payer: Self-pay | Admitting: Nurse Practitioner

## 2014-06-19 NOTE — Telephone Encounter (Signed)
Last labs 08-27-13. Please advise

## 2014-06-19 NOTE — Telephone Encounter (Signed)
Patient NTBS for follow up and lab work  

## 2014-06-20 ENCOUNTER — Other Ambulatory Visit: Payer: Self-pay | Admitting: Nurse Practitioner

## 2014-06-24 NOTE — Telephone Encounter (Signed)
Last seen 04/22/14 C Hawks  Last lipids 08/27/13

## 2014-07-17 ENCOUNTER — Other Ambulatory Visit: Payer: Self-pay | Admitting: *Deleted

## 2014-07-17 MED ORDER — HYDROCHLOROTHIAZIDE 25 MG PO TABS: ORAL_TABLET | ORAL | Status: DC

## 2014-07-17 NOTE — Telephone Encounter (Signed)
Last ov 04/22/14

## 2014-08-16 ENCOUNTER — Other Ambulatory Visit: Payer: Self-pay | Admitting: Family

## 2014-08-19 ENCOUNTER — Other Ambulatory Visit: Payer: Self-pay | Admitting: Nurse Practitioner

## 2014-08-19 DIAGNOSIS — Z1231 Encounter for screening mammogram for malignant neoplasm of breast: Secondary | ICD-10-CM

## 2014-08-19 NOTE — Telephone Encounter (Signed)
Last lipids 9/14. Pt ntbs

## 2014-08-27 ENCOUNTER — Telehealth: Payer: Self-pay | Admitting: Family

## 2014-08-28 ENCOUNTER — Ambulatory Visit (HOSPITAL_COMMUNITY)
Admission: RE | Admit: 2014-08-28 | Discharge: 2014-08-28 | Disposition: A | Discharge status: Home / Self Care | Payer: BC Managed Care – PPO | Source: Ambulatory Visit | Attending: Nurse Practitioner | Admitting: Nurse Practitioner

## 2014-08-28 DIAGNOSIS — Z1231 Encounter for screening mammogram for malignant neoplasm of breast: Secondary | ICD-10-CM | POA: Insufficient documentation

## 2014-08-28 MED ORDER — PRAVASTATIN SODIUM 20 MG PO TABS: ORAL_TABLET | ORAL | Status: DC

## 2014-08-28 NOTE — Telephone Encounter (Signed)
RX refill sent to pharmacy.

## 2014-09-24 ENCOUNTER — Encounter: Payer: Self-pay | Admitting: *Deleted

## 2014-09-24 ENCOUNTER — Encounter: Payer: Self-pay | Admitting: Family

## 2014-09-24 ENCOUNTER — Ambulatory Visit (INDEPENDENT_AMBULATORY_CARE_PROVIDER_SITE_OTHER): Payer: BC Managed Care – PPO | Admitting: Family

## 2014-09-24 VITALS — BP 114/71 | HR 73 | Temp 97.3°F | Ht 68.0 in | Wt 290.6 lb

## 2014-09-24 DIAGNOSIS — E785 Hyperlipidemia, unspecified: Secondary | ICD-10-CM

## 2014-09-24 DIAGNOSIS — Z01419 Encounter for gynecological examination (general) (routine) without abnormal findings: Secondary | ICD-10-CM

## 2014-09-24 DIAGNOSIS — R319 Hematuria, unspecified: Secondary | ICD-10-CM

## 2014-09-24 DIAGNOSIS — I10 Essential (primary) hypertension: Secondary | ICD-10-CM | POA: Insufficient documentation

## 2014-09-24 DIAGNOSIS — M5440 Lumbago with sciatica, unspecified side: Secondary | ICD-10-CM

## 2014-09-24 DIAGNOSIS — J329 Chronic sinusitis, unspecified: Secondary | ICD-10-CM

## 2014-09-24 DIAGNOSIS — Z0000 Encounter for general adult medical examination without abnormal findings: Secondary | ICD-10-CM

## 2014-09-24 DIAGNOSIS — J302 Other seasonal allergic rhinitis: Secondary | ICD-10-CM

## 2014-09-24 DIAGNOSIS — N390 Urinary tract infection, site not specified: Secondary | ICD-10-CM

## 2014-09-24 MED ORDER — MELOXICAM 15 MG PO TABS: 15.0000 mg | ORAL_TABLET | Freq: Every day | ORAL | Status: DC

## 2014-09-24 MED ORDER — CETIRIZINE HCL 10 MG PO TABS: 10.0000 mg | ORAL_TABLET | Freq: Every day | ORAL | Status: DC

## 2014-09-24 MED ORDER — HYDROCHLOROTHIAZIDE 25 MG PO TABS: ORAL_TABLET | ORAL | Status: DC

## 2014-09-24 MED ORDER — PRAVASTATIN SODIUM 20 MG PO TABS: ORAL_TABLET | ORAL | Status: DC

## 2014-09-24 NOTE — Addendum Note (Signed)
Addended by: Earlene Plater on: 09/24/2014 04:52 PM   Modules accepted: Orders

## 2014-09-24 NOTE — Patient Instructions (Signed)

## 2014-09-24 NOTE — Progress Notes (Signed)
Subjective:    Patient ID: Lauren Gardner, female    DOB: 08-Nov-1961, 53 y.o.   MRN: [id number]  Pt presents to the office for annual with pap. She has the following chronic conditions: Hypertension This is a chronic problem. The current episode started more than 1 year ago. The problem has been resolved since onset. The problem is controlled. Pertinent negatives include no anxiety, headaches, palpitations, peripheral edema or shortness of breath. Risk factors for coronary artery disease include dyslipidemia, obesity and post-menopausal state. Past treatments include diuretics. The current treatment provides moderate improvement. There is no history of kidney disease, CAD/MI, CVA, heart failure or a thyroid problem. There is no history of sleep apnea.  Hyperlipidemia This is a chronic problem. The current episode started more than 1 year ago. The problem is uncontrolled. Recent lipid tests were reviewed and are high. Exacerbating diseases include obesity. She has no history of hypothyroidism. Factors aggravating her hyperlipidemia include fatty foods. Pertinent negatives include no leg pain, myalgias or shortness of breath. Current antihyperlipidemic treatment includes statins. The current treatment provides moderate improvement of lipids. Risk factors for coronary artery disease include dyslipidemia, hypertension, obesity and a sedentary lifestyle.      Review of Systems  Constitutional: Negative.   HENT: Negative.   Eyes: Negative.   Respiratory: Negative.  Negative for shortness of breath.   Cardiovascular: Negative.  Negative for palpitations.  Gastrointestinal: Negative.   Endocrine: Negative.   Genitourinary: Negative.   Musculoskeletal: Negative.  Negative for myalgias.  Neurological: Negative.  Negative for headaches.  Hematological: Negative.   Psychiatric/Behavioral: Negative.   All other systems reviewed and are negative.      Objective:   Physical Exam  Vitals  reviewed. Constitutional: She is oriented to person, place, and time. She appears well-developed and well-nourished. No distress.  HENT:  Head: Normocephalic and atraumatic.  Right Ear: External ear normal.  Mouth/Throat: Oropharynx is clear and moist.  Eyes: Pupils are equal, round, and reactive to light.  Neck: Normal range of motion. Neck supple. No thyromegaly present.  Cardiovascular: Normal rate, regular rhythm, normal heart sounds and intact distal pulses.   No murmur heard. Pulmonary/Chest: Effort normal and breath sounds normal. No respiratory distress. She has no wheezes. Right breast exhibits no inverted nipple, no mass, no nipple discharge, no skin change and no tenderness. Left breast exhibits no inverted nipple, no mass, no nipple discharge, no skin change and no tenderness. Breasts are symmetrical.  Abdominal: Soft. Bowel sounds are normal. She exhibits no distension. There is no tenderness.  Genitourinary: Vagina normal.  Bimanual exam- no adnexal masses or tenderness, ovaries nonpalpable   Cervix parous and pink- No discharge   Musculoskeletal: Normal range of motion. She exhibits no edema and no tenderness.  Neurological: She is alert and oriented to person, place, and time. She has normal reflexes. No cranial nerve deficit.  Skin: Skin is warm and dry.  Psychiatric: She has a normal mood and affect. Her behavior is normal. Judgment and thought content normal.    BP 114/71  Pulse 73  Temp(Src) 97.3 F (36.3 C) (Oral)  Ht 5' 8"  (1.727 m)  Wt 290 lb 9.6 oz (131.815 kg)  BMI 44.20 kg/m2       Assessment & Plan:  1. Seasonal allergic rhinitis - CMP14+EGFR - cetirizine (ZYRTEC) 10 MG tablet; Take 1 tablet (10 mg total) by mouth daily.  Dispense: 90 tablet; Refill: 4  2. Essential hypertension - CMP14+EGFR - hydrochlorothiazide (HYDRODIURIL) 25 MG tablet;  TAKE 1 TABLET BY MOUTH EVERY DAY  Dispense: 90 tablet; Refill: 4  3. Hyperlipidemia - CMP14+EGFR - Lipid  panel - pravastatin (PRAVACHOL) 20 MG tablet; TAKE 1 TABLET (20 MG TOTAL) BY MOUTH DAILY.  Dispense: 90 tablet; Refill: 3  4. Chronic sinusitis, unspecified location - CMP14+EGFR - cetirizine (ZYRTEC) 10 MG tablet; Take 1 tablet (10 mg total) by mouth daily.  Dispense: 90 tablet; Refill: 4  5. Annual physical exam - CMP14+EGFR - Thyroid Panel With TSH - Vit D  25 hydroxy (rtn osteoporosis monitoring) - Pap IG w/ reflex to HPV when ASC-U  6. Encounter for routine gynecological examination - CMP14+EGFR - Pap IG w/ reflex to HPV when ASC-U   Continue all meds Labs pending Health Maintenance reviewed-hemoccult cards given to patient with directions Diet and exercise encouraged RTO 6 months  Evelina Dun, FNP

## 2014-09-24 NOTE — Addendum Note (Signed)
Addended by: Earlene Plater on: 09/24/2014 04:50 PM   Modules accepted: Orders

## 2014-09-25 ENCOUNTER — Other Ambulatory Visit: Payer: BC Managed Care – PPO

## 2014-09-25 ENCOUNTER — Telehealth: Payer: Self-pay | Admitting: Family

## 2014-09-25 DIAGNOSIS — Z1212 Encounter for screening for malignant neoplasm of rectum: Secondary | ICD-10-CM

## 2014-09-25 DIAGNOSIS — E559 Vitamin D deficiency, unspecified: Secondary | ICD-10-CM

## 2014-09-25 LAB — CMP14+EGFR
ALBUMIN: 4.3 g/dL (ref 3.5–5.5)
ALK PHOS: 96 IU/L (ref 39–117)
ALT: 17 IU/L (ref 0–32)
AST: 14 IU/L (ref 0–40)
Albumin/Globulin Ratio: 1.6 (ref 1.1–2.5)
BUN / CREAT RATIO: 14 (ref 9–23)
BUN: 11 mg/dL (ref 6–24)
CO2: 27 mmol/L (ref 18–29)
Calcium: 9.2 mg/dL (ref 8.7–10.2)
Chloride: 100 mmol/L (ref 97–108)
Creatinine, Ser: 0.79 mg/dL (ref 0.57–1.00)
GFR calc Af Amer: 100 mL/min/{1.73_m2} (ref >59)
GFR calc non Af Amer: 87 mL/min/{1.73_m2} (ref >59)
Globulin, Total: 2.7 g/dL (ref 1.5–4.5)
Glucose: 80 mg/dL (ref 65–99)
Potassium: 3.8 mmol/L (ref 3.5–5.2)
SODIUM: 142 mmol/L (ref 134–144)
Total Bilirubin: 0.3 mg/dL (ref 0.0–1.2)
Total Protein: 7 g/dL (ref 6.0–8.5)

## 2014-09-25 LAB — LIPID PANEL
CHOLESTEROL TOTAL: 194 mg/dL (ref 100–199)
Chol/HDL Ratio: 4.7 ratio units — ABNORMAL HIGH (ref 0.0–4.4)
HDL: 41 mg/dL (ref >39)
LDL Calculated: 132 mg/dL — ABNORMAL HIGH (ref 0–99)
TRIGLYCERIDES: 106 mg/dL (ref 0–149)
VLDL Cholesterol Cal: 21 mg/dL (ref 5–40)

## 2014-09-25 LAB — THYROID PANEL WITH TSH
Free Thyroxine Index: 2.2 (ref 1.2–4.9)
T3 UPTAKE RATIO: 28 % (ref 24–39)
T4, Total: 7.7 ug/dL (ref 4.5–12.0)
TSH: 4.4 u[IU]/mL (ref 0.450–4.500)

## 2014-09-25 LAB — VITAMIN D 25 HYDROXY (VIT D DEFICIENCY, FRACTURES): VIT D 25 HYDROXY: 11.5 ng/mL — AB (ref 30.0–100.0)

## 2014-09-25 MED ORDER — VITAMIN D (ERGOCALCIFEROL) 1.25 MG (50000 UNIT) PO CAPS: 50000.0000 [IU] | ORAL_CAPSULE | ORAL | Status: DC

## 2014-09-25 NOTE — Telephone Encounter (Signed)
lmovm returning call regarding labs & that paperwork for her employer is at the front to be picked up

## 2014-09-25 NOTE — Telephone Encounter (Signed)
Pt aware of normal labs and that Vit D is low. Vit D 50000 Rx has been sent to [facility].

## 2014-09-26 ENCOUNTER — Telehealth: Payer: Self-pay | Admitting: Family

## 2014-09-26 LAB — POCT UA - MICROSCOPIC ONLY
BACTERIA, U MICROSCOPIC: NEGATIVE
Casts, Ur, LPF, POC: NEGATIVE
Crystals, Ur, HPF, POC: NEGATIVE
MUCUS UA: NEGATIVE
Yeast, UA: NEGATIVE

## 2014-09-26 LAB — POCT URINALYSIS DIPSTICK
Bilirubin, UA: NEGATIVE
GLUCOSE UA: NEGATIVE
KETONES UA: NEGATIVE
Nitrite, UA: NEGATIVE
Protein, UA: NEGATIVE
Spec Grav, UA: 1.015
Urobilinogen, UA: NEGATIVE
pH, UA: 6.5

## 2014-09-26 LAB — FECAL OCCULT BLOOD, IMMUNOCHEMICAL: Fecal Occult Bld: NEGATIVE

## 2014-09-26 NOTE — Telephone Encounter (Signed)
Gave details of normal results except vitamin D is low.  RX was sent to pharmacy. Wellness form is at front ready.

## 2014-09-26 NOTE — Addendum Note (Signed)
Addended by: Selmer Dominion on: 09/26/2014 11:55 AM   Modules accepted: Orders

## 2014-09-26 NOTE — Addendum Note (Signed)
Addended by: Selmer Dominion on: 09/26/2014 11:17 AM   Modules accepted: Orders

## 2014-09-28 LAB — URINE CULTURE

## 2014-10-01 ENCOUNTER — Other Ambulatory Visit: Payer: Self-pay | Admitting: Family

## 2014-10-01 MED ORDER — PRAVASTATIN SODIUM 40 MG PO TABS: 40.0000 mg | ORAL_TABLET | Freq: Every day | ORAL | Status: DC

## 2014-10-01 MED ORDER — NITROFURANTOIN MONOHYD MACRO 100 MG PO CAPS: 100.0000 mg | ORAL_CAPSULE | Freq: Two times a day (BID) | ORAL | Status: DC

## 2014-10-02 LAB — PAP IG W/ RFLX HPV ASCU: PAP Smear Comment: 0

## 2014-10-02 LAB — HPV DNA PROBE HIGH RISK, AMPLIFIED: HPV, high-risk: NEGATIVE

## 2014-10-03 ENCOUNTER — Other Ambulatory Visit: Payer: Self-pay | Admitting: Family

## 2014-10-03 ENCOUNTER — Telehealth: Payer: Self-pay | Admitting: Family

## 2014-10-03 DIAGNOSIS — R87619 Unspecified abnormal cytological findings in specimens from cervix uteri: Secondary | ICD-10-CM

## 2014-10-03 NOTE — Telephone Encounter (Signed)
disussed results with patient

## 2014-10-14 ENCOUNTER — Encounter: Payer: Self-pay | Admitting: *Deleted

## 2014-11-04 ENCOUNTER — Telehealth: Payer: Self-pay | Admitting: Family

## 2014-11-04 NOTE — Telephone Encounter (Signed)
Prescription for #12 capsules was sent in 09/2014.  There should be #8 capsules remaining on the prescription.  Tried to call patient - Lmov about rx.

## 2014-12-04 ENCOUNTER — Other Ambulatory Visit: Payer: Self-pay | Admitting: Nurse Practitioner

## 2015-01-09 ENCOUNTER — Encounter: Payer: Self-pay | Admitting: Family

## 2015-01-09 ENCOUNTER — Ambulatory Visit (INDEPENDENT_AMBULATORY_CARE_PROVIDER_SITE_OTHER): Payer: BLUE CROSS/BLUE SHIELD | Admitting: Family

## 2015-01-09 VITALS — BP 145/93 | HR 74 | Temp 97.5°F | Ht 68.0 in | Wt 295.6 lb

## 2015-01-09 DIAGNOSIS — Z1211 Encounter for screening for malignant neoplasm of colon: Secondary | ICD-10-CM

## 2015-01-09 DIAGNOSIS — J019 Acute sinusitis, unspecified: Secondary | ICD-10-CM

## 2015-01-09 DIAGNOSIS — I8390 Asymptomatic varicose veins of unspecified lower extremity: Secondary | ICD-10-CM

## 2015-01-09 DIAGNOSIS — I868 Varicose veins of other specified sites: Secondary | ICD-10-CM

## 2015-01-09 MED ORDER — AMOXICILLIN-POT CLAVULANATE 875-125 MG PO TABS: 1.0000 | ORAL_TABLET | Freq: Two times a day (BID) | ORAL | Status: DC

## 2015-01-09 NOTE — Progress Notes (Signed)
   Subjective:    Patient ID: Lauren Gardner, female    DOB: Sep 12, 1961, 54 y.o.   MRN: [id number]  Sinusitis This is a new problem. The current episode started in the past 7 days. The problem is unchanged. Her pain is at a severity of 6/10. The pain is moderate. Associated symptoms include chills, congestion, coughing, ear pain, a hoarse voice, neck pain, sinus pressure and sneezing. Pertinent negatives include no headaches, shortness of breath or sore throat. Past treatments include acetaminophen and oral decongestants. The treatment provided mild relief.      Review of Systems  Constitutional: Positive for chills.  HENT: Positive for congestion, ear pain, hoarse voice, sinus pressure and sneezing. Negative for sore throat.   Eyes: Negative.   Respiratory: Positive for cough. Negative for shortness of breath.   Cardiovascular: Negative.  Negative for palpitations.  Gastrointestinal: Negative.   Endocrine: Negative.   Genitourinary: Negative.   Musculoskeletal: Positive for neck pain.  Neurological: Negative.  Negative for headaches.  Hematological: Negative.   Psychiatric/Behavioral: Negative.   All other systems reviewed and are negative.      Objective:   Physical Exam  Constitutional: She is oriented to person, place, and time. She appears well-developed and well-nourished. No distress.  HENT:  Head: Normocephalic and atraumatic.  Right Ear: External ear normal.  Left Ear: External ear normal.  Nose: Right sinus exhibits maxillary sinus tenderness and frontal sinus tenderness. Left sinus exhibits maxillary sinus tenderness and frontal sinus tenderness.  Nasal passage erythemas with mild swelling    Eyes: Pupils are equal, round, and reactive to light.  Neck: Normal range of motion. Neck supple. No thyromegaly present.  Cardiovascular: Normal rate, regular rhythm, normal heart sounds and intact distal pulses.   No murmur heard. Pulmonary/Chest: Effort normal and breath  sounds normal. No respiratory distress. She has no wheezes.  Abdominal: Soft. Bowel sounds are normal. She exhibits no distension. There is no tenderness.  Musculoskeletal: Normal range of motion. She exhibits no edema or tenderness.  Neurological: She is alert and oriented to person, place, and time.  Skin: Skin is warm and dry.  Psychiatric: She has a normal mood and affect. Her behavior is normal. Judgment and thought content normal.  Vitals reviewed.    Ht 5\' 8"  (1.727 m)  Wt 295 lb 9.6 oz (134.083 kg)  BMI 44.96 kg/m2      Assessment & Plan:  1. Acute sinusitis, recurrence not specified, unspecified location -- Take meds as prescribed - Use a cool mist humidifier  -Use saline nose sprays frequently -Saline irrigations of the nose can be very helpful if done frequently.  * 4X daily for 1 week*  * Use of a nettie pot can be helpful with this. Follow directions with this* -Force fluids -For any cough or congestion  Use plain Mucinex- regular strength or max strength is fine   * Children- consult with Pharmacist for dosing -For fever or aces or pains- take tylenol or ibuprofen appropriate for age and weight.  * for fevers greater than 101 orally you may alternate ibuprofen and tylenol every  3 hours. -Throat lozenges if help - amoxicillin-clavulanate (AUGMENTIN) 875-125 MG per tablet; Take 1 tablet by mouth 2 (two) times daily.  Dispense: 14 tablet; Refill: 0  2. Varicose veins -Compression stocking everyday -Elevate feet when possible - Ambulatory referral to Vascular Surgery  3. Encounter for screening colonoscopy - Ambulatory referral to Gastroenterology  Evelina Dun, FNP

## 2015-01-09 NOTE — Patient Instructions (Addendum)
Sinusitis Sinusitis is redness, soreness, and inflammation of the paranasal sinuses. Paranasal sinuses are air pockets within the bones of your face (beneath the eyes, the middle of the forehead, or above the eyes). In healthy paranasal sinuses, mucus is able to drain out, and air is able to circulate through them by way of your nose. However, when your paranasal sinuses are inflamed, mucus and air can become trapped. This can allow bacteria and other germs to grow and cause infection. Sinusitis can develop quickly and last only a short time (acute) or continue over a long period (chronic). Sinusitis that lasts for more than 12 weeks is considered chronic.  CAUSES  Causes of sinusitis include:  Allergies.  Structural abnormalities, such as displacement of the cartilage that separates your nostrils (deviated septum), which can decrease the air flow through your nose and sinuses and affect sinus drainage.  Functional abnormalities, such as when the small hairs (cilia) that line your sinuses and help remove mucus do not work properly or are not present. SIGNS AND SYMPTOMS  Symptoms of acute and chronic sinusitis are the same. The primary symptoms are pain and pressure around the affected sinuses. Other symptoms include:  Upper toothache.  Earache.  Headache.  Bad breath.  Decreased sense of smell and taste.  A cough, which worsens when you are lying flat.  Fatigue.  Fever.  Thick drainage from your nose, which often is green and may contain pus (purulent).  Swelling and warmth over the affected sinuses. DIAGNOSIS  Your health care provider will perform a physical exam. During the exam, your health care provider may:  Look in your nose for signs of abnormal growths in your nostrils (nasal polyps).  Tap over the affected sinus to check for signs of infection.  View the inside of your sinuses (endoscopy) using an imaging device that has a light attached (endoscope). If your health  care provider suspects that you have chronic sinusitis, one or more of the following tests may be recommended:  Allergy tests.  Nasal culture. A sample of mucus is taken from your nose, sent to a lab, and screened for bacteria.  Nasal cytology. A sample of mucus is taken from your nose and examined by your health care provider to determine if your sinusitis is related to an allergy. TREATMENT  Most cases of acute sinusitis are related to a viral infection and will resolve on their own within 10 days. Sometimes medicines are prescribed to help relieve symptoms (pain medicine, decongestants, nasal steroid sprays, or saline sprays).  However, for sinusitis related to a bacterial infection, your health care provider will prescribe antibiotic medicines. These are medicines that will help kill the bacteria causing the infection.  Rarely, sinusitis is caused by a fungal infection. In theses cases, your health care provider will prescribe antifungal medicine. For some cases of chronic sinusitis, surgery is needed. Generally, these are cases in which sinusitis recurs more than 3 times per year, despite other treatments. HOME CARE INSTRUCTIONS   Drink plenty of water. Water helps thin the mucus so your sinuses can drain more easily.  Use a humidifier.  Inhale steam 3 to 4 times a day (for example, sit in the bathroom with the shower running).  Apply a warm, moist washcloth to your face 3 to 4 times a day, or as directed by your health care provider.  Use saline nasal sprays to help moisten and clean your sinuses.  Take medicines only as directed by your health care provider.    If you were prescribed either an antibiotic or antifungal medicine, finish it all even if you start to feel better. SEEK IMMEDIATE MEDICAL CARE IF:  You have increasing pain or severe headaches.  You have nausea, vomiting, or drowsiness.  You have swelling around your face.  You have vision problems.  You have a stiff  neck.  You have difficulty breathing. MAKE SURE YOU:   Understand these instructions.  Will watch your condition.  Will get help right away if you are not doing well or get worse. Document Released: 12/06/2005 Document Revised: 04/22/2014 Document Reviewed: 12/21/2011 [facility] Patient Information 2015 [place], [place]. This information is not intended to replace advice given to you by your health care provider. Make sure you discuss any questions you have with your health care provider.  Varicose Veins Varicose veins are veins that have become enlarged and twisted. CAUSES This condition is the result of valves in the veins not working properly. Valves in the veins help return blood from the leg to the heart. If these valves are damaged, blood flows backwards and backs up into the veins in the leg near the skin. This causes the veins to become larger. People who are on their feet a lot, who are pregnant, or who are overweight are more likely to develop varicose veins. SYMPTOMS   Bulging, twisted-appearing, bluish veins, most commonly found on the legs.  Leg pain or a feeling of heaviness. These symptoms may be worse at the end of the day.  Leg swelling.  Skin color changes. DIAGNOSIS  Varicose veins can usually be diagnosed with an exam of your legs by your caregiver. He or she may recommend an ultrasound of your leg veins. TREATMENT  Most varicose veins can be treated at home.However, other treatments are available for people who have persistent symptoms or who want to treat the cosmetic appearance of the varicose veins. These include:  Laser treatment of very small varicose veins.  Medicine that is shot (injected) into the vein. This medicine hardens the walls of the vein and closes off the vein. This treatment is called sclerotherapy. Afterwards, you may need to wear clothing or bandages that apply pressure.  Surgery. HOME CARE INSTRUCTIONS   Do not stand or sit in one  position for long periods of time. Do not sit with your legs crossed. Rest with your legs raised during the day.  Wear elastic stockings or support hose. Do not wear other tight, encircling garments around the legs, pelvis, or waist.  Walk as much as possible to increase blood flow.  Raise the foot of your bed at night with 2-inch blocks.  If you get a cut in the skin over the vein and the vein bleeds, lie down with your leg raised and press on it with a clean cloth until the bleeding stops. Then place a bandage (dressing) on the cut. See your caregiver if it continues to bleed or needs stitches. SEEK MEDICAL CARE IF:   The skin around your ankle starts to break down.  You have pain, redness, tenderness, or hard swelling developing in your leg over a vein.  You are uncomfortable due to leg pain. Document Released: 09/15/2005 Document Revised: 02/28/2012 Document Reviewed: 02/01/2011 [facility] Patient Information 2015 [place], [place]. This information is not intended to replace advice given to you by your health care provider. Make sure you discuss any questions you have with your health care provider.

## 2015-01-13 ENCOUNTER — Telehealth: Payer: Self-pay | Admitting: Family

## 2015-01-13 MED ORDER — AZITHROMYCIN 250 MG PO TABS: ORAL_TABLET | ORAL | Status: DC

## 2015-01-13 NOTE — Telephone Encounter (Signed)
New antibiotic sent to pharmacy. 

## 2015-01-29 ENCOUNTER — Other Ambulatory Visit: Payer: Self-pay | Admitting: *Deleted

## 2015-01-29 DIAGNOSIS — I8390 Asymptomatic varicose veins of unspecified lower extremity: Secondary | ICD-10-CM

## 2015-02-27 ENCOUNTER — Encounter (HOSPITAL_COMMUNITY): Payer: Self-pay

## 2015-02-27 ENCOUNTER — Encounter: Payer: Self-pay | Admitting: Vascular Surgery

## 2015-03-05 ENCOUNTER — Other Ambulatory Visit: Payer: Self-pay

## 2015-03-05 ENCOUNTER — Telehealth: Payer: Self-pay | Admitting: Family

## 2015-03-05 DIAGNOSIS — Z1211 Encounter for screening for malignant neoplasm of colon: Secondary | ICD-10-CM

## 2015-03-13 ENCOUNTER — Encounter (INDEPENDENT_AMBULATORY_CARE_PROVIDER_SITE_OTHER): Payer: Self-pay | Admitting: *Deleted

## 2015-03-27 ENCOUNTER — Other Ambulatory Visit (INDEPENDENT_AMBULATORY_CARE_PROVIDER_SITE_OTHER): Payer: Self-pay | Admitting: *Deleted

## 2015-03-27 DIAGNOSIS — Z1211 Encounter for screening for malignant neoplasm of colon: Secondary | ICD-10-CM

## 2015-03-27 DIAGNOSIS — Z800 Family history of malignant neoplasm of digestive organs: Secondary | ICD-10-CM

## 2015-04-01 ENCOUNTER — Other Ambulatory Visit: Payer: Self-pay | Admitting: Family

## 2015-04-08 ENCOUNTER — Other Ambulatory Visit: Payer: Self-pay | Admitting: *Deleted

## 2015-04-08 DIAGNOSIS — I83813 Varicose veins of bilateral lower extremities with pain: Secondary | ICD-10-CM

## 2015-04-09 ENCOUNTER — Encounter: Payer: Self-pay | Admitting: Vascular Surgery

## 2015-04-10 ENCOUNTER — Ambulatory Visit (HOSPITAL_COMMUNITY)
Admission: RE | Admit: 2015-04-10 | Discharge: 2015-04-10 | Disposition: A | Discharge status: Home / Self Care | Payer: BLUE CROSS/BLUE SHIELD | Source: Ambulatory Visit | Attending: Vascular Surgery | Admitting: Vascular Surgery

## 2015-04-10 ENCOUNTER — Ambulatory Visit (INDEPENDENT_AMBULATORY_CARE_PROVIDER_SITE_OTHER): Payer: BLUE CROSS/BLUE SHIELD | Admitting: Vascular Surgery

## 2015-04-10 ENCOUNTER — Encounter: Payer: Self-pay | Admitting: Vascular Surgery

## 2015-04-10 VITALS — BP 125/87 | HR 78 | Ht 68.0 in | Wt 299.6 lb

## 2015-04-10 DIAGNOSIS — Z716 Tobacco abuse counseling: Secondary | ICD-10-CM | POA: Diagnosis not present

## 2015-04-10 DIAGNOSIS — I83813 Varicose veins of bilateral lower extremities with pain: Secondary | ICD-10-CM | POA: Insufficient documentation

## 2015-04-10 NOTE — Progress Notes (Signed)
[facility] [place] HISTORY AND PHYSICAL   History of Present Illness:  Patient is a 54 y.o. year old female who presents for evaluation of a cluster of varicosities on her left posterior thigh. She states that she occasionally has some pain in this area. This usually occurs in the evenings when something brushes across all she is sleeping. She occasionally takes nonsteroidals with some relief of pain. She states the area also hurts when it rains outside. She has worn some light compression knee-high stockings in the past with some relief. She denies any prior bleeding from the area. She denies any prior history of DVT. She was concerned and wanted to be evaluated to make sure this was not a blood clot. She does smoke and use oral contraceptives. She was counseled about the increased risk of venous thrombosis in the combination of these 2 factors. She will try to quit smoking. Greater than 3 minutes today spent regarding smoking cessation counseling. She denies any family history of varicose veins. She does get some exercise at work where she walks a significant amount.  Other medical problems include asthma, hyperlipidemia, hypertension all of which are currently stable. She also has a problem with obesity. Conversation was held today regarding some nutritional suggestions exercise program in order to try to lose weight.  Past Medical History  Diagnosis Date  . Asthma   . Hyperlipidemia   . Hypertension     No past surgical history on file.  Social History History  Substance Use Topics  . Smoking status: Light Tobacco Smoker  . Smokeless tobacco: Not on file  . Alcohol Use: No    Family History Family History  Problem Relation Age of Onset  . Hyperlipidemia Mother   . Hypertension Mother   . Cancer Sister   . Diabetes Sister   . Hyperlipidemia Sister   . Hypertension Sister     Allergies  Allergies  Allergen Reactions  . Amoxicillin   . Prednisolone       Current Outpatient Prescriptions  Medication Sig Dispense Refill  . azithromycin (ZITHROMAX) 250 MG tablet Take 500 mg once, then 250 mg for four days 6 tablet 0  . cetirizine (ZYRTEC) 10 MG tablet Take 1 tablet (10 mg total) by mouth daily. 90 tablet 4  . Fexofenadine HCl (MUCINEX ALLERGY PO) Take by mouth.    . hydrochlorothiazide (HYDRODIURIL) 25 MG tablet TAKE 1 TABLET BY MOUTH EVERY DAY 90 tablet 4  . meloxicam (MOBIC) 15 MG tablet Take 1 tablet (15 mg total) by mouth daily. 30 tablet 3  . Montelukast Sodium (SINGULAIR PO) Take by mouth.    . norethindrone (MICRONOR,CAMILA,ERRIN) 0.35 MG tablet TAKE 1 TABLET BY MOUTH EVERY DAY 28 tablet 10  . pravastatin (PRAVACHOL) 40 MG tablet Take 1 tablet (40 mg total) by mouth daily. 90 tablet 3  . Vitamin D, Ergocalciferol, (DRISDOL) 50000 UNITS CAPS capsule TAKE ONE CAPSULE BY MOUTH EVERY 7 DAYS 12 capsule 3   No current facility-administered medications for this visit.    ROS:   General:  No weight loss, Fever, chills  HEENT: No recent headaches, no nasal bleeding, no visual changes, no sore throat  Neurologic: No dizziness, blackouts, seizures. No recent symptoms of stroke or mini- stroke. No recent episodes of slurred speech, or temporary blindness.  Cardiac: No recent episodes of chest pain/pressure, no shortness of breath at rest.  No shortness of breath with exertion.  Denies history of atrial fibrillation or irregular heartbeat  Vascular: No history of rest pain in feet.  No history of claudication.  No history of non-healing ulcer, No history of DVT   Pulmonary: No home oxygen, no productive cough, no hemoptysis,  + asthma or wheezing  Musculoskeletal:  [ ]  Arthritis, [ ]  Low back pain,  [ ]  Joint pain  Hematologic:No history of hypercoagulable state.  No history of easy bleeding.  No history of anemia  Gastrointestinal: No hematochezia or melena,  No gastroesophageal reflux, no trouble swallowing  Urinary: [ ]  chronic  Kidney disease, [ ]  on HD - [ ]  MWF or [ ]  TTHS, [ ]  Burning with urination, [ ]  Frequent urination, [ ]  Difficulty urinating;   Skin: No rashes  Psychological: No history of anxiety,  No history of depression   Physical Examination  Filed Vitals:   04/10/15 1507  BP: 125/87  Pulse: 78  Height: 5\' 8"  (1.727 m)  Weight: 299 lb 9.6 oz (135.898 kg)  SpO2: 96%    Body mass index is 45.56 kg/(m^2).  General:  Alert and oriented, no acute distress HEENT: Normal Neck: No bruit or JVD Pulmonary: Clear to auscultation bilaterally Cardiac: Regular Rate and Rhythm without murmur Abdomen: Soft, non-tender, non-distended, no mass, obese Skin: No rash, cluster of reticular spider-type varicosities in the left posterior thigh just above the knee the area of this cluster of reticularis is about 7 cm. She also has a smaller collection of these that are less prominent about 3-4 cm diameter in the right posterior knee she also has a small reticular area in the left lateral calf approximately 2 cm in diameter. Extremity Pulses:  2+ radial, brachial, femoral, dorsalis pedis, posterior tibial pulses bilaterally Musculoskeletal: No deformity or edema  Neurologic: Upper and lower extremity motor 5/5 and symmetric  DATA:  Patient had a venous duplex exam today. This showed no evidence of superficial or deep venous thrombosis. She did have evidence of deep vein reflux bilaterally. She had no superficial system venous reflux throughout her greater lesser saphenous.   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   PLAN:  Pathophysiology of venous disease was discussed with the patient today. There are no really good interventions for deep vein reflux. However I did inform her that 75% of patients will improve with weight loss. She was also given recommendations today for bilateral lower extremity compression stockings and given a  brochure on different types that she may be able to order to give her symptomatically. I also discussed with her the possibility of sclerotherapy of these areas if she wishes to pursue that in the future for symptomatic relief. She was primarily reassured by the fact that she did not have a blood clot and was happy with this. She will follow-up with [place] on as-needed basis.  Ruta Hinds, MD [facility] [place] Office: [phone] Pager: [phone]

## 2015-04-18 ENCOUNTER — Telehealth (INDEPENDENT_AMBULATORY_CARE_PROVIDER_SITE_OTHER): Payer: Self-pay | Admitting: *Deleted

## 2015-04-18 DIAGNOSIS — Z1211 Encounter for screening for malignant neoplasm of colon: Secondary | ICD-10-CM

## 2015-04-18 MED ORDER — PEG 3350-KCL-NA BICARB-NACL 420 G PO SOLR: 4000.0000 mL | Freq: Once | ORAL | Status: DC

## 2015-04-18 NOTE — Telephone Encounter (Signed)
Patient needs trilyte 

## 2015-05-21 ENCOUNTER — Telehealth (INDEPENDENT_AMBULATORY_CARE_PROVIDER_SITE_OTHER): Payer: Self-pay | Admitting: *Deleted

## 2015-05-21 NOTE — Telephone Encounter (Signed)
Referring MD/PCP: christy hawks, np ([facility])   Procedure: tcs  Reason/Indication:  Screening, fam hx colon ca  Has patient had this procedure before?  no  If so, when, by whom and where?    Is there a family history of colon cancer?  Yes, aunt  Who?  What age when diagnosed?    Is patient diabetic?   no      Does patient have prosthetic heart valve?  no  Do you have a pacemaker?  no  Has patient ever had endocarditis? no  Has patient had joint replacement within last 12 months?  no  Does patient tend to be constipated or take laxatives? no  Is patient on Coumadin, Plavix and/or Aspirin? no  Medications: ibuprofen prn, pravastatin 40 mg daily, hctz 25 mg daily, norethindrone daily  Allergies: see epic  Medication Adjustment:   Procedure date & time: 06/18/15 at 930

## 2015-05-21 NOTE — Telephone Encounter (Signed)
agree

## 2015-05-22 ENCOUNTER — Other Ambulatory Visit: Payer: Self-pay | Admitting: Family Medicine

## 2015-06-18 ENCOUNTER — Encounter (HOSPITAL_COMMUNITY)
Admission: RE | Disposition: A | Discharge status: Home / Self Care | Payer: Self-pay | Source: Ambulatory Visit | Attending: Internal Medicine

## 2015-06-18 ENCOUNTER — Ambulatory Visit (HOSPITAL_COMMUNITY)
Admission: RE | Admit: 2015-06-18 | Discharge: 2015-06-18 | Disposition: A | Discharge status: Home / Self Care | Payer: BLUE CROSS/BLUE SHIELD | Source: Ambulatory Visit | Attending: Internal Medicine | Admitting: Internal Medicine

## 2015-06-18 ENCOUNTER — Encounter (HOSPITAL_COMMUNITY): Payer: Self-pay | Admitting: *Deleted

## 2015-06-18 DIAGNOSIS — K648 Other hemorrhoids: Secondary | ICD-10-CM | POA: Insufficient documentation

## 2015-06-18 DIAGNOSIS — Z800 Family history of malignant neoplasm of digestive organs: Secondary | ICD-10-CM

## 2015-06-18 DIAGNOSIS — Z79899 Other long term (current) drug therapy: Secondary | ICD-10-CM | POA: Insufficient documentation

## 2015-06-18 DIAGNOSIS — D125 Benign neoplasm of sigmoid colon: Secondary | ICD-10-CM | POA: Diagnosis not present

## 2015-06-18 DIAGNOSIS — F17210 Nicotine dependence, cigarettes, uncomplicated: Secondary | ICD-10-CM | POA: Insufficient documentation

## 2015-06-18 DIAGNOSIS — Z791 Long term (current) use of non-steroidal anti-inflammatories (NSAID): Secondary | ICD-10-CM | POA: Diagnosis not present

## 2015-06-18 DIAGNOSIS — I10 Essential (primary) hypertension: Secondary | ICD-10-CM | POA: Diagnosis not present

## 2015-06-18 DIAGNOSIS — Z793 Long term (current) use of hormonal contraceptives: Secondary | ICD-10-CM | POA: Insufficient documentation

## 2015-06-18 DIAGNOSIS — K5730 Diverticulosis of large intestine without perforation or abscess without bleeding: Secondary | ICD-10-CM | POA: Diagnosis not present

## 2015-06-18 DIAGNOSIS — J45909 Unspecified asthma, uncomplicated: Secondary | ICD-10-CM | POA: Diagnosis not present

## 2015-06-18 DIAGNOSIS — Z1211 Encounter for screening for malignant neoplasm of colon: Secondary | ICD-10-CM | POA: Diagnosis not present

## 2015-06-18 DIAGNOSIS — E785 Hyperlipidemia, unspecified: Secondary | ICD-10-CM | POA: Insufficient documentation

## 2015-06-18 HISTORY — PX: COLONOSCOPY: SHX5424

## 2015-06-18 SURGERY — COLONOSCOPY
Anesthesia: Moderate Sedation

## 2015-06-18 MED ORDER — MEPERIDINE HCL 50 MG/ML IJ SOLN
INTRAMUSCULAR | Status: DC | PRN
  Administered 2015-06-18 (×2): 25 mg

## 2015-06-18 MED ORDER — SODIUM CHLORIDE 0.9 % IV SOLN
INTRAVENOUS | Status: DC
  Administered 2015-06-18: 09:00:00 via INTRAVENOUS

## 2015-06-18 MED ORDER — MEPERIDINE HCL 50 MG/ML IJ SOLN
INTRAMUSCULAR | Status: AC
  Filled 2015-06-18: qty 1

## 2015-06-18 MED ORDER — MIDAZOLAM HCL 5 MG/5ML IJ SOLN
INTRAMUSCULAR | Status: DC | PRN
  Administered 2015-06-18 (×3): 2 mg via INTRAVENOUS

## 2015-06-18 MED ORDER — SIMETHICONE 40 MG/0.6ML PO SUSP
ORAL | Status: DC | PRN
  Administered 2015-06-18: 09:00:00

## 2015-06-18 MED ORDER — MIDAZOLAM HCL 5 MG/5ML IJ SOLN
INTRAMUSCULAR | Status: AC
  Filled 2015-06-18: qty 10

## 2015-06-18 NOTE — Discharge Instructions (Signed)
Resume usual medications and diet. No driving for 24 hours. Physician will call with biopsy results.  Colonoscopy A colonoscopy is an exam to look at the entire large intestine (colon). This exam can help find problems such as tumors, polyps, inflammation, and areas of bleeding. The exam takes about 1 hour.  LET [facility] CARE PROVIDER KNOW ABOUT:   Any allergies you have.  All medicines you are taking, including vitamins, herbs, eye drops, creams, and over-the-counter medicines.  Previous problems you or members of your family have had with the use of anesthetics.  Any blood disorders you have.  Previous surgeries you have had.  Medical conditions you have. RISKS AND COMPLICATIONS  Generally, this is a safe procedure. However, as with any procedure, complications can occur. Possible complications include:  Bleeding.  Tearing or rupture of the colon wall.  Reaction to medicines given during the exam.  Infection (rare). BEFORE THE PROCEDURE   Ask your health care provider about changing or stopping your regular medicines.  You may be prescribed an oral bowel prep. This involves drinking a large amount of medicated liquid, starting the day before your procedure. The liquid will cause you to have multiple loose stools until your stool is almost clear or light green. This cleans out your colon in preparation for the procedure.  Do not eat or drink anything else once you have started the bowel prep, unless your health care provider tells you it is safe to do so.  Arrange for someone to drive you home after the procedure. PROCEDURE   You will be given medicine to help you relax (sedative).  You will lie on your side with your knees bent.  A long, flexible tube with a light and camera on the end (colonoscope) will be inserted through the rectum and into the colon. The camera sends video back to a computer screen as it moves through the colon. The colonoscope also releases  carbon dioxide gas to inflate the colon. This helps your health care provider see the area better.  During the exam, your health care provider may take a small tissue sample (biopsy) to be examined under a microscope if any abnormalities are found.  The exam is finished when the entire colon has been viewed. AFTER THE PROCEDURE   Do not drive for 24 hours after the exam.  You may have a small amount of blood in your stool.  You may pass moderate amounts of gas and have mild abdominal cramping or bloating. This is caused by the gas used to inflate your colon during the exam.  Ask when your test results will be ready and how you will get your results. Make sure you get your test results. Document Released: 12/03/2000 Document Revised: 09/26/2013 Document Reviewed: 08/13/2013 [facility] Patient Information 2015 [place], [place]. This information is not intended to replace advice given to you by your health care provider. Make sure you discuss any questions you have with your health care provider. Colon Polyps Polyps are lumps of extra tissue growing inside the body. Polyps can grow in the large intestine (colon). Most colon polyps are noncancerous (benign). However, some colon polyps can become cancerous over time. Polyps that are larger than a pea may be harmful. To be safe, caregivers remove and test all polyps. CAUSES  Polyps form when mutations in the genes cause your cells to grow and divide even though no more tissue is needed. RISK FACTORS There are a number of risk factors that can increase your  chances of getting colon polyps. They include:  Being older than 50 years.  Family history of colon polyps or colon cancer.  Long-term colon diseases, such as colitis or Crohn disease.  Being overweight.  Smoking.  Being inactive.  Drinking too much alcohol. SYMPTOMS  Most small polyps do not cause symptoms. If symptoms are present, they may include:  Blood in the stool. The stool may  look dark red or black.  Constipation or diarrhea that lasts longer than 1 week. DIAGNOSIS People often do not know they have polyps until their caregiver finds them during a regular checkup. Your caregiver can use 4 tests to check for polyps:  Digital rectal exam. The caregiver wears gloves and feels inside the rectum. This test would find polyps only in the rectum.  Barium enema. The caregiver puts a liquid called barium into your rectum before taking X-rays of your colon. Barium makes your colon look white. Polyps are dark, so they are easy to see in the X-ray pictures.  Sigmoidoscopy. A thin, flexible tube (sigmoidoscope) is placed into your rectum. The sigmoidoscope has a light and tiny camera in it. The caregiver uses the sigmoidoscope to look at the last third of your colon.  Colonoscopy. This test is like sigmoidoscopy, but the caregiver looks at the entire colon. This is the most common method for finding and removing polyps. TREATMENT  Any polyps will be removed during a sigmoidoscopy or colonoscopy. The polyps are then tested for cancer. PREVENTION  To help lower your risk of getting more colon polyps:  Eat plenty of fruits and vegetables. Avoid eating fatty foods.  Do not smoke.  Avoid drinking alcohol.  Exercise every day.  Lose weight if recommended by your caregiver.  Eat plenty of calcium and folate. Foods that are rich in calcium include milk, cheese, and broccoli. Foods that are rich in folate include chickpeas, kidney beans, and spinach. HOME CARE INSTRUCTIONS Keep all follow-up appointments as directed by your caregiver. You may need periodic exams to check for polyps. SEEK MEDICAL CARE IF: You notice bleeding during a bowel movement. Document Released: 09/01/2004 Document Revised: 02/28/2012 Document Reviewed: 02/15/2012 [facility] Patient Information 2015 [place], [place]. This information is not intended to replace advice given to you by your health care  provider. Make sure you discuss any questions you have with your health care provider.

## 2015-06-18 NOTE — H&P (Addendum)
Lauren Gardner is an 54 y.o. female.   Chief Complaint: Patient is here for colonoscopy. HPI: Patient is 54 year old African-American female who is here for screening colonoscopy. She denies abdominal pain change in bowel habits frank rectal bleeding. Every now and then she notices blood on tissue secondary to hemorrhoids. Family history is significant for CRC in maternal aunt who is around 33 at the time of diagnosis.  Past Medical History  Diagnosis Date  . Asthma   . Hyperlipidemia   . Hypertension     Past Surgical History  Procedure Laterality Date  . No past surgeries      Family History  Problem Relation Age of Onset  . Hyperlipidemia Mother   . Hypertension Mother   . Cancer Sister   . Diabetes Sister   . Hyperlipidemia Sister   . Hypertension Sister   . Colon cancer Other    Social History:  reports that she has been smoking Cigarettes.  She has a 1 pack-year smoking history. She does not have any smokeless tobacco history on file. She reports that she does not drink alcohol or use illicit drugs.  Allergies:  Allergies  Allergen Reactions  . Amoxicillin Anaphylaxis, Itching and Swelling  . Prednisolone Swelling    Medications Prior to Admission  Medication Sig Dispense Refill  . CAMILA 0.35 MG tablet TAKE 1 TABLET BY MOUTH EVERY DAY 28 tablet 3  . cetirizine (ZYRTEC) 10 MG tablet Take 1 tablet (10 mg total) by mouth daily. 90 tablet 4  . hydrochlorothiazide (HYDRODIURIL) 25 MG tablet TAKE 1 TABLET BY MOUTH EVERY DAY 90 tablet 4  . meloxicam (MOBIC) 15 MG tablet Take 1 tablet (15 mg total) by mouth daily. 30 tablet 3  . montelukast (SINGULAIR) 10 MG tablet Take 10 mg by mouth at bedtime.    . polyethylene glycol-electrolytes (NULYTELY/GOLYTELY) 420 G solution Take 4,000 mLs by mouth once. 4000 mL 0  . pravastatin (PRAVACHOL) 40 MG tablet Take 1 tablet (40 mg total) by mouth daily. 90 tablet 3  . Vitamin D, Ergocalciferol, (DRISDOL) 50000 UNITS CAPS capsule TAKE  ONE CAPSULE BY MOUTH EVERY 7 DAYS 12 capsule 3    No results found for this or any previous visit (from the past 48 hour(s)). No results found.  ROS  Blood pressure 132/76, pulse 68, temperature 98.1 F (36.7 C), temperature source Oral, resp. rate 18, height 5\' 9"  (1.753 m), weight 285 lb (129.275 kg), SpO2 95 %. Physical Exam  Constitutional:  Well-developed obese African-American female in NAD  HENT:  Mouth/Throat: Oropharynx is clear and moist.  Eyes: Conjunctivae are normal. No scleral icterus.  Neck: No thyromegaly present.  Cardiovascular: Normal rate, regular rhythm and normal heart sounds.   No murmur heard. Respiratory: Effort normal and breath sounds normal.  GI: Soft. She exhibits no distension and no mass. There is no tenderness.  Musculoskeletal: She exhibits no edema.  Lymphadenopathy:    She has no cervical adenopathy.  Neurological: She is alert.  Skin: Skin is warm and dry.     Assessment/Plan Average risk screening colonoscopy. Family stressors CRC in maternal aunt who was close to 21 at the time of diagnosis.  Lauren Gardner U 06/18/2015, 9:18 AM

## 2015-06-18 NOTE — Op Note (Addendum)
COLONOSCOPY PROCEDURE REPORT  PATIENT:  Lauren Gardner  MR#:  [id number] Birthdate:  12/04/1961, 54 y.o., female Endoscopist:  Dr. Rogene Houston, MD Referred By:  Ms. Theador Hawthorne. Lenna Gilford, FNP  Procedure Date: 06/18/2015  Procedure:   Colonoscopy  Indications:  Patient is 54 year old African-American female who is undergoing average risk screening colonoscopy. Family history is positive for CRC in maternal aunt who was around 33 at the time of diagnosis.  Informed Consent:  The procedure and risks were reviewed with the patient and informed consent was obtained.  Medications:  Demerol 50 mg IV Versed 6 mg IV  Description of procedure:  After a digital rectal exam was performed, that colonoscope was advanced from the anus through the rectum and colon to the area of the cecum, ileocecal valve and appendiceal orifice. The cecum was deeply intubated. These structures were well-seen and photographed for the record. From the level of the cecum and ileocecal valve, the scope was slowly and cautiously withdrawn. The mucosal surfaces were carefully surveyed utilizing scope tip to flexion to facilitate fold flattening as needed. The scope was pulled down into the rectum where a thorough exam including retroflexion was performed. Terminal ileum was also examined.  Findings:   Prep excellent. Normal mucosa of terminal ileum. Cluster of four small cecal diverticula. four small polyps ablated via cold biopsy from sigmoid colon and submitted together. Normal rectal mucosa. Prominent hemorrhoids above the dentate line.   Therapeutic/Diagnostic Maneuvers Performed:  See above  Complications:  None  Cecal Withdrawal Time:  19 minutes  Impression:  Normal mucosa of terminal ileum. Four small cecal diverticula. Four small polyps ablated by cold biopsy from mid sigmoid colon and submitted together. Internal hemorrhoids.  Recommendations:  Standard instructions given. I will contact patient with  biopsy results and further recommendations.  Thomas Rhude U  06/18/2015 10:03 AM  CC: Dr. Lenna Gilford, Theador Hawthorne, FNP & Dr. Rayne Du ref. provider found

## 2015-06-20 ENCOUNTER — Encounter (HOSPITAL_COMMUNITY): Payer: Self-pay | Admitting: Internal Medicine

## 2015-06-27 ENCOUNTER — Encounter (INDEPENDENT_AMBULATORY_CARE_PROVIDER_SITE_OTHER): Payer: Self-pay | Admitting: *Deleted

## 2015-08-01 ENCOUNTER — Other Ambulatory Visit: Payer: Self-pay | Admitting: Nurse Practitioner

## 2015-08-01 DIAGNOSIS — Z1231 Encounter for screening mammogram for malignant neoplasm of breast: Secondary | ICD-10-CM

## 2015-09-08 ENCOUNTER — Ambulatory Visit (HOSPITAL_COMMUNITY)
Admission: RE | Admit: 2015-09-08 | Discharge: 2015-09-08 | Disposition: A | Discharge status: Home / Self Care | Payer: BLUE CROSS/BLUE SHIELD | Source: Ambulatory Visit | Attending: Nurse Practitioner | Admitting: Nurse Practitioner

## 2015-09-08 DIAGNOSIS — Z1231 Encounter for screening mammogram for malignant neoplasm of breast: Secondary | ICD-10-CM | POA: Insufficient documentation

## 2015-09-14 ENCOUNTER — Other Ambulatory Visit: Payer: Self-pay | Admitting: Family

## 2015-09-15 NOTE — Telephone Encounter (Signed)
Last seen 01/09/15 Christy 

## 2015-09-26 ENCOUNTER — Encounter: Payer: Self-pay | Admitting: Nurse Practitioner

## 2015-09-26 ENCOUNTER — Ambulatory Visit (INDEPENDENT_AMBULATORY_CARE_PROVIDER_SITE_OTHER): Payer: BLUE CROSS/BLUE SHIELD | Admitting: Nurse Practitioner

## 2015-09-26 VITALS — BP 113/76 | HR 79 | Temp 97.2°F | Ht 68.0 in | Wt 304.0 lb

## 2015-09-26 DIAGNOSIS — Z01419 Encounter for gynecological examination (general) (routine) without abnormal findings: Secondary | ICD-10-CM

## 2015-09-26 DIAGNOSIS — I10 Essential (primary) hypertension: Secondary | ICD-10-CM | POA: Diagnosis not present

## 2015-09-26 DIAGNOSIS — E785 Hyperlipidemia, unspecified: Secondary | ICD-10-CM

## 2015-09-26 DIAGNOSIS — Z0000 Encounter for general adult medical examination without abnormal findings: Secondary | ICD-10-CM | POA: Diagnosis not present

## 2015-09-26 LAB — POCT URINALYSIS DIPSTICK
Bilirubin, UA: NEGATIVE
Glucose, UA: NEGATIVE
Ketones, UA: NEGATIVE
Leukocytes, UA: NEGATIVE
NITRITE UA: NEGATIVE
PH UA: 6.5
Protein, UA: NEGATIVE
Spec Grav, UA: 1.01
Urobilinogen, UA: NEGATIVE

## 2015-09-26 LAB — POCT UA - MICROSCOPIC ONLY
Bacteria, U Microscopic: NEGATIVE
Casts, Ur, LPF, POC: NEGATIVE
Crystals, Ur, HPF, POC: NEGATIVE
Mucus, UA: NEGATIVE
WBC, UR, HPF, POC: NEGATIVE
Yeast, UA: NEGATIVE

## 2015-09-26 MED ORDER — HYDROCHLOROTHIAZIDE 25 MG PO TABS: ORAL_TABLET | ORAL | Status: DC

## 2015-09-26 MED ORDER — PRAVASTATIN SODIUM 40 MG PO TABS: 40.0000 mg | ORAL_TABLET | Freq: Every day | ORAL | Status: DC

## 2015-09-26 NOTE — Progress Notes (Signed)
Subjective:    Patient ID: Lauren Gardner, female    DOB: 1961-10-12, 54 y.o.   MRN: [id number]   Patient in today for CPE and PAP- SHe is doing well today - her only complaint is dizziness that started yesterday and lasted a couple of minutes. Said she had bent over to pick something up out of the floor and when she stood up she was dizzy. Slight dizziness this morning. Chronic medical problems include:   Hypertension This is a chronic problem. The current episode started more than 1 year ago. The problem is unchanged. The problem is controlled. Pertinent negatives include no anxiety, palpitations, peripheral edema or shortness of breath. Risk factors for coronary artery disease include dyslipidemia, obesity and post-menopausal state. Past treatments include diuretics. The current treatment provides moderate improvement. Compliance problems include exercise and diet.  There is no history of CAD/MI or CVA.  Hyperlipidemia This is a chronic problem. The current episode started more than 1 year ago. Recent lipid tests were reviewed and are variable. Factors aggravating her hyperlipidemia include thiazides. Pertinent negatives include no myalgias or shortness of breath. Current antihyperlipidemic treatment includes statins. The current treatment provides moderate improvement of lipids. Compliance problems include adherence to diet and adherence to exercise.  Risk factors for coronary artery disease include dyslipidemia, hypertension, obesity and post-menopausal.  vitamin d def Vitamin d 50,000 u weekly- no side effects      Review of Systems  Constitutional: Negative.   HENT: Negative.   Respiratory: Negative.  Negative for shortness of breath.   Cardiovascular: Negative.  Negative for palpitations.  Gastrointestinal: Negative.   Genitourinary: Negative.   Musculoskeletal: Negative for myalgias.  Neurological: Positive for dizziness.  Psychiatric/Behavioral: Negative.   All other systems  reviewed and are negative.      Objective:   Physical Exam  Constitutional: She is oriented to person, place, and time. She appears well-developed and well-nourished.  HENT:  Head: Normocephalic.  Right Ear: Hearing, tympanic membrane, external ear and ear canal normal.  Left Ear: Hearing, tympanic membrane, external ear and ear canal normal.  Nose: Nose normal.  Mouth/Throat: Uvula is midline and oropharynx is clear and moist.  Eyes: Conjunctivae and EOM are normal. Pupils are equal, round, and reactive to light.  Neck: Normal range of motion and full passive range of motion without pain. Neck supple. No JVD present. Carotid bruit is not present. No thyroid mass and no thyromegaly present.  Cardiovascular: Normal rate, normal heart sounds and intact distal pulses.   No murmur heard. Pulmonary/Chest: Effort normal and breath sounds normal. Right breast exhibits no inverted nipple, no mass, no nipple discharge, no skin change and no tenderness. Left breast exhibits no inverted nipple, no mass, no nipple discharge, no skin change and no tenderness.  Abdominal: Soft. Bowel sounds are normal. She exhibits no mass. There is no tenderness.  Genitourinary: Vagina normal and uterus normal. No breast swelling, tenderness, discharge or bleeding.  bimanual exam-No adnexal masses or tenderness. Cervix nonparous and pink- no vaginal discharge  Musculoskeletal: Normal range of motion.  Lymphadenopathy:    She has no cervical adenopathy.  Neurological: She is alert and oriented to person, place, and time.  Skin: Skin is warm and dry.  Psychiatric: She has a normal mood and affect. Her behavior is normal. Judgment and thought content normal.    BP 113/76 mmHg  Pulse 79  Temp(Src) 97.2 F (36.2 C) (Oral)  Ht _0  (1.727 m)  Wt 304 lb (137.893 kg)  BMI 46.23 kg/m2       Assessment & Plan:  1. Annual physical exam - POCT urinalysis dipstick - POCT UA - Microscopic Only - CBC with  Differential/Platelet - Thyroid Panel With TSH  2. Encounter for routine gynecological examination - Pap IG w/ reflex to HPV when ASC-U  3. Essential hypertension Do not add salt to diet - hydrochlorothiazide (HYDRODIURIL) 25 MG tablet; TAKE 1 TABLET BY MOUTH EVERY DAY  Dispense: 90 tablet; Refill: 3 - CMP14+EGFR  4. Hyperlipidemia Low fat diet - pravastatin (PRAVACHOL) 40 MG tablet; Take 1 tablet (40 mg total) by mouth daily.  Dispense: 90 tablet; Refill: 3 - Lipid panel  5. Morbid obesity, unspecified obesity type ([place]) Discussed diet and exercise for person with BMI >25 Will recheck weight in 3-6 months     Labs pending Health maintenance reviewed Diet and exercise encouraged Continue all meds Follow up  In 6 month   [place], FNP

## 2015-09-26 NOTE — Patient Instructions (Signed)

## 2015-09-27 LAB — CBC WITH DIFFERENTIAL/PLATELET
BASOS: 0 %
Basophils Absolute: 0 10*3/uL (ref 0.0–0.2)
EOS (ABSOLUTE): 0.1 10*3/uL (ref 0.0–0.4)
EOS: 1 %
HEMATOCRIT: 39.1 % (ref 34.0–46.6)
HEMOGLOBIN: 13.4 g/dL (ref 11.1–15.9)
Immature Grans (Abs): 0 10*3/uL (ref 0.0–0.1)
Immature Granulocytes: 0 %
LYMPHS ABS: 2.5 10*3/uL (ref 0.7–3.1)
Lymphs: 27 %
MCH: 29.8 pg (ref 26.6–33.0)
MCHC: 34.3 g/dL (ref 31.5–35.7)
MCV: 87 fL (ref 79–97)
MONOCYTES: 6 %
MONOS ABS: 0.6 10*3/uL (ref 0.1–0.9)
NEUTROS ABS: 6.1 10*3/uL (ref 1.4–7.0)
Neutrophils: 66 %
Platelets: 344 10*3/uL (ref 150–379)
RBC: 4.5 x10E6/uL (ref 3.77–5.28)
RDW: 13.4 % (ref 12.3–15.4)
WBC: 9.3 10*3/uL (ref 3.4–10.8)

## 2015-09-27 LAB — CMP14+EGFR
A/G RATIO: 1.7 (ref 1.1–2.5)
ALT: 18 IU/L (ref 0–32)
AST: 16 IU/L (ref 0–40)
Albumin: 4.3 g/dL (ref 3.5–5.5)
Alkaline Phosphatase: 82 IU/L (ref 39–117)
BUN/Creatinine Ratio: 13 (ref 9–23)
BUN: 10 mg/dL (ref 6–24)
Bilirubin Total: 0.2 mg/dL (ref 0.0–1.2)
CHLORIDE: 99 mmol/L (ref 97–108)
CO2: 29 mmol/L (ref 18–29)
Calcium: 9.2 mg/dL (ref 8.7–10.2)
Creatinine, Ser: 0.75 mg/dL (ref 0.57–1.00)
GFR calc non Af Amer: 92 mL/min/{1.73_m2} (ref >59)
GFR, EST AFRICAN AMERICAN: 106 mL/min/{1.73_m2} (ref >59)
GLUCOSE: 93 mg/dL (ref 65–99)
Globulin, Total: 2.6 g/dL (ref 1.5–4.5)
POTASSIUM: 3.4 mmol/L — AB (ref 3.5–5.2)
Sodium: 141 mmol/L (ref 134–144)
TOTAL PROTEIN: 6.9 g/dL (ref 6.0–8.5)

## 2015-09-27 LAB — LIPID PANEL
Chol/HDL Ratio: 4.9 ratio units — ABNORMAL HIGH (ref 0.0–4.4)
Cholesterol, Total: 181 mg/dL (ref 100–199)
HDL: 37 mg/dL — ABNORMAL LOW (ref >39)
LDL Calculated: 121 mg/dL — ABNORMAL HIGH (ref 0–99)
TRIGLYCERIDES: 116 mg/dL (ref 0–149)
VLDL CHOLESTEROL CAL: 23 mg/dL (ref 5–40)

## 2015-09-27 LAB — THYROID PANEL WITH TSH
Free Thyroxine Index: 2.1 (ref 1.2–4.9)
T3 Uptake Ratio: 27 % (ref 24–39)
T4, Total: 7.8 ug/dL (ref 4.5–12.0)
TSH: 1.93 u[IU]/mL (ref 0.450–4.500)

## 2015-09-29 ENCOUNTER — Telehealth: Payer: Self-pay | Admitting: Nurse Practitioner

## 2015-09-29 NOTE — Telephone Encounter (Signed)
Pt aware - results not back yet  

## 2015-09-30 LAB — PAP IG W/ RFLX HPV ASCU: PAP Smear Comment: 0

## 2015-11-05 ENCOUNTER — Other Ambulatory Visit: Payer: Self-pay | Admitting: Family Medicine

## 2015-11-15 ENCOUNTER — Encounter: Payer: Self-pay | Admitting: Family Medicine

## 2015-11-15 ENCOUNTER — Ambulatory Visit (INDEPENDENT_AMBULATORY_CARE_PROVIDER_SITE_OTHER): Payer: BLUE CROSS/BLUE SHIELD | Admitting: Family Medicine

## 2015-11-15 VITALS — BP 139/93 | HR 92 | Temp 97.0°F | Ht 68.0 in | Wt 300.4 lb

## 2015-11-15 DIAGNOSIS — J019 Acute sinusitis, unspecified: Secondary | ICD-10-CM | POA: Diagnosis not present

## 2015-11-15 MED ORDER — AZITHROMYCIN 250 MG PO TABS
ORAL_TABLET | ORAL | Status: DC
Start: 2015-11-15 — End: 2015-11-22

## 2015-11-15 MED ORDER — FLUTICASONE PROPIONATE 50 MCG/ACT NA SUSP: 1.0000 | Freq: Two times a day (BID) | NASAL | Status: DC | PRN

## 2015-11-15 NOTE — Progress Notes (Signed)
BP 139/93 mmHg  Pulse 92  Temp(Src) 97 F (36.1 C) (Oral)  Ht 5\' 8"  (1.727 m)  Wt 300 lb 6.4 oz (136.261 kg)  BMI 45.69 kg/m2   Subjective:    Patient ID: Lauren Gardner, female    DOB: 04-Sep-1961, 54 y.o.   MRN: [id number]  HPI: Lauren Gardner is a 54 y.o. female presenting on 11/15/2015 for Sinusitis and Cough   HPI Sinus pressure and congestion Patient has sinus pressure and congestion that has been going on since yesterday. She admits to having allergies that are significant around this time year and started taking cetirizine. She is not taking her Singulair yet. She also uses Mucinex without much success. She does admit that she had a family member that was ill last week. She denies fevers or chills. She does have significant frontal and maxillary sinus pressure headache. She also admits to postnasal drainage causing her to have a sore throat. She does have a cough but it is nonproductive.  Relevant past medical, surgical, family and social history reviewed and updated as indicated. Interim medical history since our last visit reviewed. Allergies and medications reviewed and updated.  Review of Systems  Constitutional: Negative for fever and chills.  HENT: Positive for congestion, postnasal drip, rhinorrhea, sinus pressure, sneezing and sore throat. Negative for ear discharge and ear pain.   Eyes: Negative for pain, redness and visual disturbance.  Respiratory: Positive for cough. Negative for chest tightness and shortness of breath.   Cardiovascular: Negative for chest pain and leg swelling.  Genitourinary: Negative for dysuria and difficulty urinating.  Musculoskeletal: Negative for back pain and gait problem.  Skin: Negative for rash.  Neurological: Negative for light-headedness and headaches.  Psychiatric/Behavioral: Negative for behavioral problems and agitation.  All other systems reviewed and are negative.   Per HPI unless specifically indicated above       Medication List       This list is accurate as of: 11/15/15  9:42 AM.  Always use your most recent med list.               azithromycin 250 MG tablet  Commonly known as:  ZITHROMAX  Take 2 the first day and then one each day after.     CAMILA 0.35 MG tablet  Generic drug:  norethindrone  TAKE 1 TABLET BY MOUTH EVERY DAY     cetirizine 10 MG tablet  Commonly known as:  ZYRTEC  Take 1 tablet (10 mg total) by mouth daily.     fluticasone 50 MCG/ACT nasal spray  Commonly known as:  FLONASE  Place 1 spray into both nostrils 2 (two) times daily as needed for allergies or rhinitis.     hydrochlorothiazide 25 MG tablet  Commonly known as:  HYDRODIURIL  TAKE 1 TABLET BY MOUTH EVERY DAY     meloxicam 15 MG tablet  Commonly known as:  MOBIC  Take 1 tablet (15 mg total) by mouth daily.     montelukast 10 MG tablet  Commonly known as:  SINGULAIR  Take 10 mg by mouth at bedtime.     pravastatin 40 MG tablet  Commonly known as:  PRAVACHOL  Take 1 tablet (40 mg total) by mouth daily.     Vitamin D (Ergocalciferol) 50000 UNITS Caps capsule  Commonly known as:  DRISDOL  TAKE ONE CAPSULE BY MOUTH EVERY 7 DAYS           Objective:    BP 139/93 mmHg  Pulse 92  Temp(Src) 97 F (36.1 C) (Oral)  Ht 5\' 8"  (1.727 m)  Wt 300 lb 6.4 oz (136.261 kg)  BMI 45.69 kg/m2  Wt Readings from Last 3 Encounters:  11/15/15 300 lb 6.4 oz (136.261 kg)  09/26/15 304 lb (137.893 kg)  06/18/15 285 lb (129.275 kg)    Physical Exam  Constitutional: She is oriented to person, place, and time. She appears well-developed and well-nourished. No distress.  HENT:  Right Ear: Tympanic membrane, external ear and ear canal normal.  Left Ear: Tympanic membrane, external ear and ear canal normal.  Nose: Mucosal edema and rhinorrhea present. No epistaxis. Right sinus exhibits maxillary sinus tenderness and frontal sinus tenderness. Left sinus exhibits maxillary sinus tenderness and frontal sinus  tenderness.  Mouth/Throat: Uvula is midline and mucous membranes are normal. Posterior oropharyngeal edema and posterior oropharyngeal erythema present. No oropharyngeal exudate or tonsillar abscesses.  Eyes: Conjunctivae and EOM are normal.  Cardiovascular: Normal rate, regular rhythm, normal heart sounds and intact distal pulses.   No murmur heard. Pulmonary/Chest: Effort normal and breath sounds normal. No respiratory distress. She has no wheezes.  Musculoskeletal: Normal range of motion. She exhibits no edema or tenderness.  Neurological: She is alert and oriented to person, place, and time. Coordination normal.  Skin: Skin is warm and dry. No rash noted. She is not diaphoretic.  Psychiatric: She has a normal mood and affect. Her behavior is normal.  Vitals reviewed.      Assessment & Plan:   Problem List Items Addressed This Visit    None    Visit Diagnoses    Acute rhinosinusitis    -  Primary    Recommended Flonase and antihistamine for a few days, if not improved then pick up azithromycin.    Relevant Medications    fluticasone (FLONASE) 50 MCG/ACT nasal spray    azithromycin (ZITHROMAX) 250 MG tablet        Follow up plan: Return if symptoms worsen or fail to improve.  Counseling provided for all of the vaccine components No orders of the defined types were placed in this encounter.    Caryl Pina, MD [facility] 11/15/2015, 9:42 AM

## 2015-11-18 ENCOUNTER — Telehealth: Payer: Self-pay | Admitting: Family Medicine

## 2015-11-18 MED ORDER — BENZONATATE 200 MG PO CAPS: 200.0000 mg | ORAL_CAPSULE | Freq: Three times a day (TID) | ORAL | Status: DC | PRN

## 2015-11-18 NOTE — Telephone Encounter (Signed)
Patient aware that medication sent in

## 2015-11-18 NOTE — Telephone Encounter (Signed)
Patient has one dose of Z-pak and left and is using the Flonase.  Has developed a cough that lasts all day and is productive.  Patient states she goes back to a 10 hour work day tomorrow at [employer] and would like for [place] to call in something for her cough.

## 2015-11-18 NOTE — Telephone Encounter (Signed)
Tessalon pearls Prescription sent to pharmacy

## 2015-11-21 NOTE — Telephone Encounter (Signed)
Patient states she has finished her antibiotic and is still coughing.  Patient is still taking Tessalon perles but has not been taking her Zyrtec and is having a lot of sinus drainage that she thinks is causing the cough.  Patient instructed to begin Zyrtec and continue the Tessalon and if she is not feeling better by Monday she will need to be seen again.

## 2015-11-22 ENCOUNTER — Encounter: Payer: Self-pay | Admitting: Pediatrics

## 2015-11-22 ENCOUNTER — Ambulatory Visit (INDEPENDENT_AMBULATORY_CARE_PROVIDER_SITE_OTHER): Payer: BLUE CROSS/BLUE SHIELD | Admitting: Pediatrics

## 2015-11-22 VITALS — BP 113/70 | HR 73 | Temp 98.3°F | Ht 68.0 in | Wt 301.0 lb

## 2015-11-22 DIAGNOSIS — J4521 Mild intermittent asthma with (acute) exacerbation: Secondary | ICD-10-CM

## 2015-11-22 DIAGNOSIS — J069 Acute upper respiratory infection, unspecified: Secondary | ICD-10-CM

## 2015-11-22 DIAGNOSIS — R062 Wheezing: Secondary | ICD-10-CM | POA: Diagnosis not present

## 2015-11-22 MED ORDER — PREDNISONE 20 MG PO TABS: ORAL_TABLET | ORAL | Status: DC

## 2015-11-22 NOTE — Progress Notes (Signed)
Subjective:    Patient ID: Lauren Gardner, female    DOB: 1961/03/09, 54 y.o.   MRN: [id number]  CC: URI and Cough   HPI: Lauren Gardner is a 54 y.o. female presenting for URI and Cough  Sick for a week Seen 1 week ago and a few days ago Finished a zpak 2 days ago Nose bothering her, lots of congestion and she has coughing spells No fevers, no sore throat Breathing has been fine but coughing has been getting worse She has albuterol inhaler at home, has not been using   Depression screen Lauren Gardner 2/9 09/26/2015 04/12/2014  Decreased Interest 0 0  Down, Depressed, Hopeless 1 0  PHQ - 2 Score 1 0     Relevant past medical, surgical, family and social history reviewed and updated as indicated. Interim medical history since our last visit reviewed. Allergies and medications reviewed and updated.    ROS: Per HPI unless specifically indicated above  History  Smoking status  . Current Every Day Smoker -- 0.25 packs/day for 4 years  . Types: Cigarettes  Smokeless tobacco  . Not on file    Past Medical History Patient Active Problem List   Diagnosis Date Noted  . Essential hypertension 09/24/2014  . Hyperlipidemia 09/24/2014  . Severe obesity (BMI >= 40) (Lauren Gardner) 07/07/2013  . Seasonal allergic rhinitis 07/07/2013  . Seasonal allergic conjunctivitis 07/07/2013    Current Outpatient Prescriptions  Medication Sig Dispense Refill  . benzonatate (TESSALON) 200 MG capsule Take 1 capsule (200 mg total) by mouth 3 (three) times daily as needed. 30 capsule 1  . CAMILA 0.35 MG tablet TAKE 1 TABLET BY MOUTH EVERY DAY 28 tablet 5  . cetirizine (ZYRTEC) 10 MG tablet Take 1 tablet (10 mg total) by mouth daily. 90 tablet 4  . fluticasone (FLONASE) 50 MCG/ACT nasal spray Place 1 spray into both nostrils 2 (two) times daily as needed for allergies or rhinitis. 16 g 6  . hydrochlorothiazide (HYDRODIURIL) 25 MG tablet TAKE 1 TABLET BY MOUTH EVERY DAY 90 tablet 3  . meloxicam (MOBIC) 15 MG  tablet Take 1 tablet (15 mg total) by mouth daily. 30 tablet 3  . montelukast (SINGULAIR) 10 MG tablet Take 10 mg by mouth at bedtime.    . pravastatin (PRAVACHOL) 40 MG tablet Take 1 tablet (40 mg total) by mouth daily. 90 tablet 3  . Vitamin D, Ergocalciferol, (DRISDOL) 50000 UNITS CAPS capsule TAKE ONE CAPSULE BY MOUTH EVERY 7 DAYS 12 capsule 3  . predniSONE (DELTASONE) 20 MG tablet 2 po at same time daily for 3 days 6 tablet 0   No current facility-administered medications for this visit.       Objective:    BP 113/70 mmHg  Pulse 73  Temp(Src) 98.3 F (36.8 C) (Oral)  Ht 5\' 8"  (1.727 m)  Wt 301 lb (136.533 kg)  BMI 45.78 kg/m2  Wt Readings from Last 3 Encounters:  11/22/15 301 lb (136.533 kg)  11/15/15 300 lb 6.4 oz (136.261 kg)  09/26/15 304 lb (137.893 kg)    Gen: NAD, alert, cooperative with exam, NCAT EYES: EOMI, no scleral injection or icterus ENT:  TMs pearly gray b/l, OP with mild erythema LYMPH: no cervical LAD CV: NRRR, normal S1/S2, no murmur, distal pulses 2+ b/l Resp: moving air fair, b/l exp wheezes scattered throughout. normal WOB Abd: +BS, soft, NTND. Neuro: Alert and oriented     Assessment & Plan:    [name] was seen today for uri and  cough, Gardner to be wheezing. This is new form her visit several days ago. Pt with history of swelling with prednisolone. Unlikely that the medicine caused the swelling but non-active ingredients in the medicine may have. Discussed options, ideally would want her to have take steroids given wheezing. Could try prednisone, a different formulation of steroid with albuterol or do albuterol alone. Pt opted to do trial of prednisone. Discussed reasons to seek medical attention. Take prednisone with benadryl. Gave 3 day burst. Should also start albuterol at home 2 puffs TID. Needs to smoke smoking.  Diagnoses and all orders for this visit:  Wheezing -     predniSONE (DELTASONE) 20 MG tablet; 2 po at same time daily for 3  days  Follow up plan: As needed  For symptoms that dont improve  Lauren Found, MD [facility] 11/22/2015, 11:10 AM

## 2015-11-22 NOTE — Patient Instructions (Signed)
Albuterol 2 puffs three times a day  Prednisone 2 tabs for 3 days

## 2015-12-16 ENCOUNTER — Encounter: Payer: Self-pay | Admitting: Family Medicine

## 2015-12-16 ENCOUNTER — Ambulatory Visit (INDEPENDENT_AMBULATORY_CARE_PROVIDER_SITE_OTHER): Payer: BLUE CROSS/BLUE SHIELD | Admitting: Family Medicine

## 2015-12-16 VITALS — BP 111/74 | HR 88 | Temp 97.1°F | Ht 68.0 in | Wt 308.0 lb

## 2015-12-16 DIAGNOSIS — R059 Cough, unspecified: Secondary | ICD-10-CM

## 2015-12-16 DIAGNOSIS — M5440 Lumbago with sciatica, unspecified side: Secondary | ICD-10-CM | POA: Diagnosis not present

## 2015-12-16 DIAGNOSIS — R05 Cough: Secondary | ICD-10-CM | POA: Diagnosis not present

## 2015-12-16 DIAGNOSIS — R0981 Nasal congestion: Secondary | ICD-10-CM

## 2015-12-16 DIAGNOSIS — J329 Chronic sinusitis, unspecified: Secondary | ICD-10-CM

## 2015-12-16 MED ORDER — MELOXICAM 15 MG PO TABS: 15.0000 mg | ORAL_TABLET | Freq: Every day | ORAL | Status: DC

## 2015-12-16 MED ORDER — MONTELUKAST SODIUM 10 MG PO TABS: 10.0000 mg | ORAL_TABLET | Freq: Every day | ORAL | Status: AC

## 2015-12-16 MED ORDER — CLARITHROMYCIN 500 MG PO TABS: 500.0000 mg | ORAL_TABLET | Freq: Two times a day (BID) | ORAL | Status: DC

## 2015-12-16 NOTE — Patient Instructions (Signed)
Drink plenty of fluids and stay well hydrated  Use a cool mist humidifier regularly at nighttime in the bedroom where you're sleeping  Use saline nasal spray frequently through the day  Continue to use Flonase 1 spray each nostril at bedtime  Take Mucinex, maximum strength, blue and white in color, one twice daily with a large glass of water for cough and congestion  Take the antibiotic as directed twice daily with food for 2 weeks  We will arrange for you to have a CT scan of the sinuses and a chest x-ray at [facility]   we will recheck you in one week to make sure that you're making progress

## 2015-12-16 NOTE — Progress Notes (Signed)
Subjective:    Patient ID: Lauren Gardner, female    DOB: 10-Aug-1961, 54 y.o.   MRN: [id number]  HPI Patient here today for sinus and nasal congestion that started about 2-3 weeks ago. She has taken meds for this and it is not getting better. This is a third trip to the doctor. She did get some at her after one of the treatments and then got worse again. She is feeling miserable and has this persistent congestion and drainage and pressure in her sinuses.      Patient Active Problem List   Diagnosis Date Noted  . Wheezing 11/22/2015  . Essential hypertension 09/24/2014  . Hyperlipidemia 09/24/2014  . Severe obesity (BMI >= 40) ([place]) 07/07/2013  . Seasonal allergic rhinitis 07/07/2013  . Seasonal allergic conjunctivitis 07/07/2013   Outpatient Encounter Prescriptions as of 12/16/2015  Medication Sig  . benzonatate (TESSALON) 200 MG capsule Take 1 capsule (200 mg total) by mouth 3 (three) times daily as needed.  Marland Kitchen CAMILA 0.35 MG tablet TAKE 1 TABLET BY MOUTH EVERY DAY  . cetirizine (ZYRTEC) 10 MG tablet Take 1 tablet (10 mg total) by mouth daily.  . fluticasone (FLONASE) 50 MCG/ACT nasal spray Place 1 spray into both nostrils 2 (two) times daily as needed for allergies or rhinitis.  . hydrochlorothiazide (HYDRODIURIL) 25 MG tablet TAKE 1 TABLET BY MOUTH EVERY DAY  . meloxicam (MOBIC) 15 MG tablet Take 1 tablet (15 mg total) by mouth daily.  . pravastatin (PRAVACHOL) 40 MG tablet Take 1 tablet (40 mg total) by mouth daily.  . Vitamin D, Ergocalciferol, (DRISDOL) 50000 UNITS CAPS capsule TAKE ONE CAPSULE BY MOUTH EVERY 7 DAYS  . montelukast (SINGULAIR) 10 MG tablet Take 10 mg by mouth at bedtime. Reported on 12/16/2015  . [DISCONTINUED] predniSONE (DELTASONE) 20 MG tablet 2 po at same time daily for 3 days   No facility-administered encounter medications on file as of 12/16/2015.       Review of Systems  Constitutional: Negative.  Negative for fever.  HENT: Positive for  congestion, postnasal drip and sinus pressure.   Eyes: Negative.   Respiratory: Positive for cough.   Cardiovascular: Negative.   Gastrointestinal: Negative.   Endocrine: Negative.   Genitourinary: Negative.   Musculoskeletal: Negative.   Skin: Negative.   Allergic/Immunologic: Negative.   Neurological: Negative.   Hematological: Negative.   Psychiatric/Behavioral: Negative.        Objective:   Physical Exam  Constitutional: She is oriented to person, place, and time. She appears well-developed and well-nourished. No distress.  HENT:  Head: Normocephalic and atraumatic.  Right Ear: External ear normal.  Left Ear: External ear normal.  Mouth/Throat: Oropharynx is clear and moist.  Nasal congestion and turbinate swelling bilaterally. There is frontal sinus tenderness to palpation.  Eyes: Conjunctivae and EOM are normal. Pupils are equal, round, and reactive to light. Right eye exhibits no discharge. Left eye exhibits no discharge. No scleral icterus.  Neck: Normal range of motion. Neck supple. No thyromegaly present.  Cardiovascular: Normal rate, regular rhythm and normal heart sounds.   No murmur heard. Pulmonary/Chest: Effort normal and breath sounds normal. No respiratory distress. She has no wheezes. She has no rales.  The cough is dry but there are a few rhonchi with coughing also. There are no rales.  Musculoskeletal: Normal range of motion. She exhibits no edema.  Lymphadenopathy:    She has no cervical adenopathy.  Neurological: She is alert and oriented to person, place, and time.  Skin: Skin is warm and dry. No rash noted.  Psychiatric: She has a normal mood and affect. Her behavior is normal. Judgment and thought content normal.  Nursing note and vitals reviewed.  BP 111/74 mmHg  Pulse 88  Temp(Src) 97.1 F (36.2 C) (Oral)  Ht _0  (1.727 m)  Wt 308 lb (139.708 kg)  BMI 46.84 kg/m2        Assessment & Plan:  1. Cough -Take Mucinex, blue and white in  color, maximum strength, 1 twice daily with a large glass of water for cough and congestion - CBC with Differential/Platelet - BMP8+EGFR - DG Chest 2 View; Future  2. Congestion of nasal sinus -Use nasal saline frequently through the day -Use cool mist humidifier at night - CBC with Differential/Platelet - BMP8+EGFR - DG Chest 2 View; Future - CT Maxillofacial WO CM; Future  3. Sinusitis, unspecified chronicity, unspecified location -Take Biaxin twice daily for infection until completed with food for 2 weeks - CBC with Differential/Platelet - BMP8+EGFR - CT Maxillofacial WO CM; Future  4. Low back pain - meloxicam (MOBIC) 15 MG tablet; Take 1 tablet (15 mg total) by mouth daily.  Dispense: 30 tablet; Refill: 1   Meds ordered this encounter  Medications  . clarithromycin (BIAXIN) 500 MG tablet    Sig: Take 1 tablet (500 mg total) by mouth 2 (two) times daily.    Dispense:  20 tablet    Refill:  0  . meloxicam (MOBIC) 15 MG tablet    Sig: Take 1 tablet (15 mg total) by mouth daily.    Dispense:  30 tablet    Refill:  1  . montelukast (SINGULAIR) 10 MG tablet    Sig: Take 1 tablet (10 mg total) by mouth at bedtime. Reported on 12/16/2015    Dispense:  30 tablet    Refill:  1   Patient Instructions   Drink plenty of fluids and stay well hydrated  Use a cool mist humidifier regularly at nighttime in the bedroom where you're sleeping  Use saline nasal spray frequently through the day  Continue to use Flonase 1 spray each nostril at bedtime  Take Mucinex, maximum strength, blue and white in color, one twice daily with a large glass of water for cough and congestion  Take the antibiotic as directed twice daily with food for 2 weeks  We will arrange for you to have a CT scan of the sinuses and a chest x-ray at [facility]   we will recheck you in one week to make sure that you're making progress   Arrie Senate MD

## 2015-12-17 ENCOUNTER — Telehealth: Payer: Self-pay | Admitting: Family Medicine

## 2015-12-17 LAB — BMP8+EGFR
BUN/Creatinine Ratio: 15 (ref 9–23)
BUN: 11 mg/dL (ref 6–24)
CALCIUM: 9.4 mg/dL (ref 8.7–10.2)
CO2: 22 mmol/L (ref 18–29)
Chloride: 97 mmol/L (ref 96–106)
Creatinine, Ser: 0.75 mg/dL (ref 0.57–1.00)
GFR calc Af Amer: 106 mL/min/{1.73_m2} (ref >59)
GFR, EST NON AFRICAN AMERICAN: 92 mL/min/{1.73_m2} (ref >59)
Glucose: 91 mg/dL (ref 65–99)
Potassium: 3.8 mmol/L (ref 3.5–5.2)
Sodium: 142 mmol/L (ref 134–144)

## 2015-12-17 LAB — CBC WITH DIFFERENTIAL/PLATELET
BASOS ABS: 0 10*3/uL (ref 0.0–0.2)
Basos: 0 %
EOS (ABSOLUTE): 0.1 10*3/uL (ref 0.0–0.4)
Eos: 1 %
HEMATOCRIT: 40.4 % (ref 34.0–46.6)
Hemoglobin: 13.6 g/dL (ref 11.1–15.9)
Immature Grans (Abs): 0 10*3/uL (ref 0.0–0.1)
Immature Granulocytes: 0 %
Lymphocytes Absolute: 2 10*3/uL (ref 0.7–3.1)
Lymphs: 20 %
MCH: 30.1 pg (ref 26.6–33.0)
MCHC: 33.7 g/dL (ref 31.5–35.7)
MCV: 89 fL (ref 79–97)
Monocytes Absolute: 0.6 10*3/uL (ref 0.1–0.9)
Monocytes: 7 %
NEUTROS PCT: 72 %
Neutrophils Absolute: 6.9 10*3/uL (ref 1.4–7.0)
Platelets: 337 10*3/uL (ref 150–379)
RBC: 4.52 x10E6/uL (ref 3.77–5.28)
RDW: 13.7 % (ref 12.3–15.4)
WBC: 9.6 10*3/uL (ref 3.4–10.8)

## 2015-12-17 NOTE — Telephone Encounter (Signed)
Patient speaking to [facility] about rescheduling appointments around her work schedule.

## 2015-12-18 ENCOUNTER — Ambulatory Visit (HOSPITAL_COMMUNITY)
Admission: RE | Admit: 2015-12-18 | Discharge: 2015-12-18 | Disposition: A | Discharge status: Home / Self Care | Payer: BLUE CROSS/BLUE SHIELD | Source: Ambulatory Visit | Attending: Family Medicine | Admitting: Family Medicine

## 2015-12-18 ENCOUNTER — Ambulatory Visit (HOSPITAL_COMMUNITY): Payer: BLUE CROSS/BLUE SHIELD

## 2015-12-18 DIAGNOSIS — J329 Chronic sinusitis, unspecified: Secondary | ICD-10-CM | POA: Insufficient documentation

## 2015-12-18 DIAGNOSIS — R0981 Nasal congestion: Secondary | ICD-10-CM

## 2015-12-18 DIAGNOSIS — R05 Cough: Secondary | ICD-10-CM

## 2015-12-18 DIAGNOSIS — R0989 Other specified symptoms and signs involving the circulatory and respiratory systems: Secondary | ICD-10-CM | POA: Diagnosis not present

## 2015-12-18 DIAGNOSIS — R059 Cough, unspecified: Secondary | ICD-10-CM

## 2015-12-19 ENCOUNTER — Telehealth: Payer: Self-pay

## 2015-12-19 NOTE — Telephone Encounter (Signed)
[facility] to x-ray; All imaging studies are normal

## 2015-12-19 NOTE — Telephone Encounter (Signed)
Pt aware of all imaging results

## 2015-12-24 ENCOUNTER — Telehealth: Payer: Self-pay | Admitting: Family Medicine

## 2015-12-24 ENCOUNTER — Ambulatory Visit: Payer: BLUE CROSS/BLUE SHIELD | Admitting: Family Medicine

## 2015-12-24 MED ORDER — FLUCONAZOLE 150 MG PO TABS: 150.0000 mg | ORAL_TABLET | Freq: Once | ORAL | Status: DC

## 2015-12-24 NOTE — Telephone Encounter (Signed)
Aware,diflucan script sent in.

## 2016-04-22 ENCOUNTER — Other Ambulatory Visit: Payer: Self-pay | Admitting: Nurse Practitioner

## 2016-06-09 ENCOUNTER — Other Ambulatory Visit: Payer: Self-pay | Admitting: Family

## 2016-06-10 NOTE — Telephone Encounter (Signed)
Last seen 12/16/15  DWM  Last Vit D 09/24/14  11.5

## 2016-08-05 ENCOUNTER — Other Ambulatory Visit: Payer: Self-pay | Admitting: Nurse Practitioner

## 2016-08-05 DIAGNOSIS — Z1231 Encounter for screening mammogram for malignant neoplasm of breast: Secondary | ICD-10-CM

## 2016-09-06 ENCOUNTER — Other Ambulatory Visit: Payer: Self-pay | Admitting: Nurse Practitioner

## 2016-09-10 ENCOUNTER — Ambulatory Visit (HOSPITAL_COMMUNITY): Payer: BLUE CROSS/BLUE SHIELD

## 2016-09-13 ENCOUNTER — Ambulatory Visit (HOSPITAL_COMMUNITY)
Admission: RE | Admit: 2016-09-13 | Discharge: 2016-09-13 | Disposition: A | Discharge status: Home / Self Care | Payer: BLUE CROSS/BLUE SHIELD | Source: Ambulatory Visit | Attending: Nurse Practitioner | Admitting: Nurse Practitioner

## 2016-09-13 DIAGNOSIS — Z1231 Encounter for screening mammogram for malignant neoplasm of breast: Secondary | ICD-10-CM | POA: Insufficient documentation

## 2016-09-17 NOTE — Progress Notes (Signed)
Patient aware.

## 2016-10-07 ENCOUNTER — Encounter: Payer: Self-pay | Admitting: Nurse Practitioner

## 2016-10-07 ENCOUNTER — Other Ambulatory Visit: Payer: Self-pay | Admitting: Nurse Practitioner

## 2016-10-07 ENCOUNTER — Ambulatory Visit (INDEPENDENT_AMBULATORY_CARE_PROVIDER_SITE_OTHER): Payer: BLUE CROSS/BLUE SHIELD | Admitting: Nurse Practitioner

## 2016-10-07 VITALS — BP 112/70 | HR 79 | Temp 98.3°F | Ht 68.0 in | Wt 298.0 lb

## 2016-10-07 DIAGNOSIS — I10 Essential (primary) hypertension: Secondary | ICD-10-CM

## 2016-10-07 DIAGNOSIS — Z01419 Encounter for gynecological examination (general) (routine) without abnormal findings: Secondary | ICD-10-CM | POA: Diagnosis not present

## 2016-10-07 DIAGNOSIS — Z0000 Encounter for general adult medical examination without abnormal findings: Secondary | ICD-10-CM

## 2016-10-07 DIAGNOSIS — J019 Acute sinusitis, unspecified: Secondary | ICD-10-CM

## 2016-10-07 DIAGNOSIS — E782 Mixed hyperlipidemia: Secondary | ICD-10-CM

## 2016-10-07 DIAGNOSIS — J301 Allergic rhinitis due to pollen: Secondary | ICD-10-CM

## 2016-10-07 LAB — URINALYSIS, COMPLETE
Bilirubin, UA: NEGATIVE
Glucose, UA: NEGATIVE
Ketones, UA: NEGATIVE
Leukocytes, UA: NEGATIVE
NITRITE UA: NEGATIVE
PH UA: 7 (ref 5.0–7.5)
Protein, UA: NEGATIVE
Specific Gravity, UA: 1.015 (ref 1.005–1.030)
Urobilinogen, Ur: 1 mg/dL (ref 0.2–1.0)

## 2016-10-07 LAB — MICROSCOPIC EXAMINATION: WBC UA: NONE SEEN /HPF (ref 0–?)

## 2016-10-07 MED ORDER — PRAVASTATIN SODIUM 40 MG PO TABS: 40.0000 mg | ORAL_TABLET | Freq: Every day | ORAL | 1 refills | Status: DC

## 2016-10-07 MED ORDER — FLUTICASONE PROPIONATE 50 MCG/ACT NA SUSP: 1.0000 | Freq: Two times a day (BID) | NASAL | 6 refills | Status: DC | PRN

## 2016-10-07 MED ORDER — FLUTICASONE PROPIONATE 50 MCG/ACT NA SUSP: 1.0000 | Freq: Two times a day (BID) | NASAL | 6 refills | Status: AC | PRN

## 2016-10-07 MED ORDER — NORETHINDRONE 0.35 MG PO TABS: 1.0000 | ORAL_TABLET | Freq: Every day | ORAL | 11 refills | Status: DC

## 2016-10-07 MED ORDER — HYDROCHLOROTHIAZIDE 25 MG PO TABS: ORAL_TABLET | ORAL | 1 refills | Status: DC

## 2016-10-07 MED ORDER — HYDROCHLOROTHIAZIDE 25 MG PO TABS: ORAL_TABLET | ORAL | 1 refills | Status: AC

## 2016-10-07 NOTE — Addendum Note (Signed)
Addended by: Chevis Pretty on: 10/07/2016 03:27 PM   Modules accepted: Orders

## 2016-10-07 NOTE — Progress Notes (Signed)
 Subjective:    Patient ID: Lauren Gardner, female    DOB: 02/14/1961, 55 y.o.   MRN: [id number]    Patient here today for annual physical exam, PAP and  follow up of chronic medical problems. No complaints today.  Outpatient Encounter Prescriptions as of 10/07/2016  Medication Sig  . CAMILA 0.35 MG tablet TAKE 1 TABLET BY MOUTH EVERY DAY  . cetirizine (ZYRTEC) 10 MG tablet Take 1 tablet (10 mg total) by mouth daily.  . fluticasone (FLONASE) 50 MCG/ACT nasal spray Place 1 spray into both nostrils 2 (two) times daily as needed for allergies or rhinitis.  . hydrochlorothiazide (HYDRODIURIL) 25 MG tablet TAKE 1 TABLET BY MOUTH EVERY DAY  . montelukast (SINGULAIR) 10 MG tablet Take 1 tablet (10 mg total) by mouth at bedtime. Reported on 12/16/2015  . pravastatin (PRAVACHOL) 40 MG tablet Take 1 tablet (40 mg total) by mouth daily.  . Vitamin D, Ergocalciferol, (DRISDOL) 50000 units CAPS capsule TAKE ONE CAPSULE BY MOUTH EVERY SEVEN DAYS  . meloxicam (MOBIC) 15 MG tablet Take 1 tablet (15 mg total) by mouth daily. (Patient not taking: Reported on 10/07/2016)    Hypertension  This is a chronic problem. The current episode started more than 1 year ago. The problem is unchanged. The problem is controlled. Pertinent negatives include no anxiety, palpitations, peripheral edema or shortness of breath. Risk factors for coronary artery disease include dyslipidemia, obesity and post-menopausal state. Past treatments include diuretics. The current treatment provides moderate improvement. Compliance problems include exercise and diet.  There is no history of CAD/MI or CVA.  Hyperlipidemia  This is a chronic problem. The current episode started more than 1 year ago. Recent lipid tests were reviewed and are variable. Factors aggravating her hyperlipidemia include thiazides. Pertinent negatives include no myalgias or shortness of breath. Current antihyperlipidemic treatment includes statins. The current  treatment provides moderate improvement of lipids. Compliance problems include adherence to diet and adherence to exercise.  Risk factors for coronary artery disease include dyslipidemia, hypertension, obesity and post-menopausal.  vitamin d def Vitamin d 50,000 u weekly- no side effects Allergic rhinitis Patient takes flonase and zyrtec when has flare up.Se is doing well right now.   Review of Systems  Constitutional: Negative.   HENT: Negative.   Respiratory: Negative.  Negative for shortness of breath.   Cardiovascular: Negative.  Negative for palpitations.  Gastrointestinal: Negative.   Genitourinary: Negative.   Musculoskeletal: Negative for myalgias.  Neurological: Positive for dizziness.  Psychiatric/Behavioral: Negative.   All other systems reviewed and are negative.      Objective:   Physical Exam  Constitutional: She is oriented to person, place, and time. She appears well-developed and well-nourished.  HENT:  Head: Normocephalic.  Right Ear: Hearing, tympanic membrane, external ear and ear canal normal.  Left Ear: Hearing, tympanic membrane, external ear and ear canal normal.  Nose: Nose normal.  Mouth/Throat: Uvula is midline and oropharynx is clear and moist.  Eyes: Conjunctivae and EOM are normal. Pupils are equal, round, and reactive to light.  Neck: Normal range of motion and full passive range of motion without pain. Neck supple. No JVD present. Carotid bruit is not present. No thyroid mass and no thyromegaly present.  Cardiovascular: Normal rate, normal heart sounds and intact distal pulses.   No murmur heard. Pulmonary/Chest: Effort normal and breath sounds normal. Right breast exhibits no inverted nipple, no mass, no nipple discharge, no skin change and no tenderness. Left breast exhibits no inverted nipple, no mass,   no nipple discharge, no skin change and no tenderness.  Abdominal: Soft. Bowel sounds are normal. She exhibits no mass. There is no tenderness.    Genitourinary: Vagina normal and uterus normal. No breast swelling, tenderness, discharge or bleeding.  Genitourinary Comments: bimanual exam-No adnexal masses or tenderness. Cervix nonparous and pink- no vaginal discharge  Musculoskeletal: Normal range of motion.  Lymphadenopathy:    She has no cervical adenopathy.  Neurological: She is alert and oriented to person, place, and time.  Skin: Skin is warm and dry.  Psychiatric: She has a normal mood and affect. Her behavior is normal. Judgment and thought content normal.   BP 112/70   Pulse 79   Temp 98.3 F (36.8 C) (Oral)   Ht 5' 8" (1.727 m)   Wt 298 lb (135.2 kg)   BMI 45.31 kg/m        Assessment & Plan:  1. Annual physical exam - CBC with Differential/Platelet - Thyroid Panel With TSH - Hepatitis C antibody - STD Screen (8)  2. Encounter for gynecological examination without abnormal finding - norethindrone (CAMILA) 0.35 MG tablet; Take 1 tablet (0.35 mg total) by mouth daily.  Dispense: 28 tablet; Refill: 11 - IGP, Aptima HPV, rfx 16/18,45 - Urinalysis, Complete  3. Essential hypertension Do not add salt to diet - hydrochlorothiazide (HYDRODIURIL) 25 MG tablet; TAKE 1 TABLET BY MOUTH EVERY DAY  Dispense: 90 tablet; Refill: 1 - CMP14+EGFR  4. Mixed hyperlipidemia Low fat diet - pravastatin (PRAVACHOL) 40 MG tablet; Take 1 tablet (40 mg total) by mouth daily.  Dispense: 90 tablet; Refill: 1 - Lipid panel  5. Severe obesity (BMI >= 40) (HCC) Discussed diet and exercise for person with BMI >25 Will recheck weight in 3-6 months  6. Acute seasonal allergic rhinitis due to pollen flonase daily     Labs pending Health maintenance reviewed Diet and exercise encouraged Continue all meds Follow up  In 6 months   Mary-Margaret Martin, FNP   

## 2016-10-07 NOTE — Patient Instructions (Signed)
Hypertension Hypertension, commonly called high blood pressure, is when the force of blood pumping through your arteries is too strong. Your arteries are the blood vessels that carry blood from your heart throughout your body. A blood pressure reading consists of a higher number over a lower number, such as 110/72. The higher number (systolic) is the pressure inside your arteries when your heart pumps. The lower number (diastolic) is the pressure inside your arteries when your heart relaxes. Ideally you want your blood pressure below 120/80. Hypertension forces your heart to work harder to pump blood. Your arteries may become narrow or stiff. Having untreated or uncontrolled hypertension can cause heart attack, stroke, kidney disease, and other problems. RISK FACTORS Some risk factors for high blood pressure are controllable. Others are not.  Risk factors you cannot control include:   Race. You may be at higher risk if you are African American.  Age. Risk increases with age.  Gender. Men are at higher risk than women before age 45 years. After age 65, women are at higher risk than men. Risk factors you can control include:  Not getting enough exercise or physical activity.  Being overweight.  Getting too much fat, sugar, calories, or salt in your diet.  Drinking too much alcohol. SIGNS AND SYMPTOMS Hypertension does not usually cause signs or symptoms. Extremely high blood pressure (hypertensive crisis) may cause headache, anxiety, shortness of breath, and nosebleed. DIAGNOSIS To check if you have hypertension, your health care provider will measure your blood pressure while you are seated, with your arm held at the level of your heart. It should be measured at least twice using the same arm. Certain conditions can cause a difference in blood pressure between your right and left arms. A blood pressure reading that is higher than normal on one occasion does not mean that you need treatment. If  it is not clear whether you have high blood pressure, you may be asked to return on a different day to have your blood pressure checked again. Or, you may be asked to monitor your blood pressure at home for 1 or more weeks. TREATMENT Treating high blood pressure includes making lifestyle changes and possibly taking medicine. Living a healthy lifestyle can help lower high blood pressure. You may need to change some of your habits. Lifestyle changes may include:  Following the DASH diet. This diet is high in fruits, vegetables, and whole grains. It is low in salt, red meat, and added sugars.  Keep your sodium intake below 2,300 mg per day.  Getting at least 30-45 minutes of aerobic exercise at least 4 times per week.  Losing weight if necessary.  Not smoking.  Limiting alcoholic beverages.  Learning ways to reduce stress. Your health care provider may prescribe medicine if lifestyle changes are not enough to get your blood pressure under control, and if one of the following is true:  You are 18-59 years of age and your systolic blood pressure is above 140.  You are 60 years of age or older, and your systolic blood pressure is above 150.  Your diastolic blood pressure is above 90.  You have diabetes, and your systolic blood pressure is over 140 or your diastolic blood pressure is over 90.  You have kidney disease and your blood pressure is above 140/90.  You have heart disease and your blood pressure is above 140/90. Your personal target blood pressure may vary depending on your medical conditions, your age, and other factors. HOME CARE INSTRUCTIONS    Have your blood pressure rechecked as directed by your health care provider.   Take medicines only as directed by your health care provider. Follow the directions carefully. Blood pressure medicines must be taken as prescribed. The medicine does not work as well when you skip doses. Skipping doses also puts you at risk for  problems.  Do not smoke.   Monitor your blood pressure at home as directed by your health care provider. SEEK MEDICAL CARE IF:   You think you are having a reaction to medicines taken.  You have recurrent headaches or feel dizzy.  You have swelling in your ankles.  You have trouble with your vision. SEEK IMMEDIATE MEDICAL CARE IF:  You develop a severe headache or confusion.  You have unusual weakness, numbness, or feel faint.  You have severe chest or abdominal pain.  You vomit repeatedly.  You have trouble breathing. MAKE SURE YOU:   Understand these instructions.  Will watch your condition.  Will get help right away if you are not doing well or get worse.   This information is not intended to replace advice given to you by your health care provider. Make sure you discuss any questions you have with your health care provider.   Document Released: 12/06/2005 Document Revised: 04/22/2015 Document Reviewed: 09/28/2013 Elsevier Interactive Patient Education 2016 [employer] Inc.  

## 2016-10-07 NOTE — Telephone Encounter (Signed)
Please send this to Shelah Lewandowsky as I think she did her last pelvic exam a year ago.

## 2016-10-08 LAB — CMP14+EGFR
ALK PHOS: 87 IU/L (ref 39–117)
ALT: 14 IU/L (ref 0–32)
AST: 17 IU/L (ref 0–40)
Albumin/Globulin Ratio: 1.6 (ref 1.2–2.2)
Albumin: 4.1 g/dL (ref 3.5–5.5)
BILIRUBIN TOTAL: 0.2 mg/dL (ref 0.0–1.2)
BUN/Creatinine Ratio: 10 (ref 9–23)
BUN: 9 mg/dL (ref 6–24)
CHLORIDE: 101 mmol/L (ref 96–106)
CO2: 28 mmol/L (ref 18–29)
Calcium: 9.1 mg/dL (ref 8.7–10.2)
Creatinine, Ser: 0.87 mg/dL (ref 0.57–1.00)
GFR calc Af Amer: 88 mL/min/{1.73_m2} (ref >59)
GFR calc non Af Amer: 76 mL/min/{1.73_m2} (ref >59)
GLUCOSE: 102 mg/dL — AB (ref 65–99)
Globulin, Total: 2.5 g/dL (ref 1.5–4.5)
Potassium: 3.6 mmol/L (ref 3.5–5.2)
Sodium: 144 mmol/L (ref 134–144)
Total Protein: 6.6 g/dL (ref 6.0–8.5)

## 2016-10-08 LAB — CBC WITH DIFFERENTIAL/PLATELET
BASOS: 0 %
Basophils Absolute: 0 10*3/uL (ref 0.0–0.2)
EOS (ABSOLUTE): 0.2 10*3/uL (ref 0.0–0.4)
EOS: 2 %
HEMATOCRIT: 37.6 % (ref 34.0–46.6)
HEMOGLOBIN: 12.8 g/dL (ref 11.1–15.9)
IMMATURE GRANULOCYTES: 0 %
Immature Grans (Abs): 0 10*3/uL (ref 0.0–0.1)
LYMPHS ABS: 3 10*3/uL (ref 0.7–3.1)
Lymphs: 31 %
MCH: 29.8 pg (ref 26.6–33.0)
MCHC: 34 g/dL (ref 31.5–35.7)
MCV: 87 fL (ref 79–97)
MONOS ABS: 0.6 10*3/uL (ref 0.1–0.9)
Monocytes: 6 %
NEUTROS PCT: 61 %
Neutrophils Absolute: 6.1 10*3/uL (ref 1.4–7.0)
Platelets: 342 10*3/uL (ref 150–379)
RBC: 4.3 x10E6/uL (ref 3.77–5.28)
RDW: 13.6 % (ref 12.3–15.4)
WBC: 9.9 10*3/uL (ref 3.4–10.8)

## 2016-10-08 LAB — LIPID PANEL
CHOLESTEROL TOTAL: 180 mg/dL (ref 100–199)
Chol/HDL Ratio: 5.1 ratio units — ABNORMAL HIGH (ref 0.0–4.4)
HDL: 35 mg/dL — AB (ref >39)
LDL Calculated: 119 mg/dL — ABNORMAL HIGH (ref 0–99)
Triglycerides: 130 mg/dL (ref 0–149)
VLDL CHOLESTEROL CAL: 26 mg/dL (ref 5–40)

## 2016-10-08 LAB — STD SCREEN (8)
HEP A IGM: NEGATIVE
HEP B C IGM: NEGATIVE
HEP B S AG: NEGATIVE
HIV SCREEN 4TH GENERATION: NONREACTIVE
HSV 1 Glycoprotein G Ab, IgG: 46.1 index — ABNORMAL HIGH (ref 0.00–0.90)
HSV 2 Glycoprotein G Ab, IgG: 8.41 index — ABNORMAL HIGH (ref 0.00–0.90)
Hep C Virus Ab: <0.1 s/co ratio (ref 0.0–0.9)
RPR Ser Ql: NONREACTIVE

## 2016-10-08 LAB — THYROID PANEL WITH TSH
Free Thyroxine Index: 1.7 (ref 1.2–4.9)
T3 UPTAKE RATIO: 26 % (ref 24–39)
T4, Total: 6.7 ug/dL (ref 4.5–12.0)
TSH: 1.67 u[IU]/mL (ref 0.450–4.500)

## 2016-10-11 ENCOUNTER — Telehealth: Payer: Self-pay | Admitting: Nurse Practitioner

## 2016-10-12 ENCOUNTER — Other Ambulatory Visit: Payer: Self-pay | Admitting: Nurse Practitioner

## 2016-10-12 LAB — IGP, APTIMA HPV, RFX 16/18,45
HPV Aptima: NEGATIVE
PAP Smear Comment: 0

## 2016-10-12 MED ORDER — FLUCONAZOLE 150 MG PO TABS: 150.0000 mg | ORAL_TABLET | Freq: Once | ORAL | 0 refills | Status: AC

## 2016-10-12 NOTE — Telephone Encounter (Signed)
lmtcb jkp 10/24 

## 2016-10-20 NOTE — Telephone Encounter (Signed)
Patient aware of lab results.

## 2016-10-22 ENCOUNTER — Telehealth: Payer: Self-pay | Admitting: Nurse Practitioner

## 2016-10-22 NOTE — Telephone Encounter (Signed)
Patient advised that if her symptoms are not improving then she needs to be seen.

## 2017-01-07 ENCOUNTER — Telehealth: Payer: Self-pay | Admitting: Nurse Practitioner

## 2017-01-08 NOTE — Telephone Encounter (Signed)
Pt walked into the office yesterday and discussed this with [facility], MMM's nurse. Will close encounter.

## 2017-01-11 ENCOUNTER — Telehealth: Payer: Self-pay | Admitting: Nurse Practitioner

## 2017-01-14 ENCOUNTER — Ambulatory Visit (INDEPENDENT_AMBULATORY_CARE_PROVIDER_SITE_OTHER): Payer: BLUE CROSS/BLUE SHIELD | Admitting: Pediatrics

## 2017-01-14 ENCOUNTER — Encounter: Payer: Self-pay | Admitting: Pediatrics

## 2017-01-14 VITALS — BP 139/82 | HR 96 | Temp 98.7°F | Ht 68.0 in | Wt 294.2 lb

## 2017-01-14 DIAGNOSIS — J069 Acute upper respiratory infection, unspecified: Secondary | ICD-10-CM | POA: Diagnosis not present

## 2017-01-14 DIAGNOSIS — Z20828 Contact with and (suspected) exposure to other viral communicable diseases: Secondary | ICD-10-CM

## 2017-01-14 MED ORDER — OSELTAMIVIR PHOSPHATE 75 MG PO CAPS: 75.0000 mg | ORAL_CAPSULE | Freq: Every day | ORAL | 0 refills | Status: DC

## 2017-01-14 NOTE — Patient Instructions (Addendum)
Netipot with distilled water 2-3 times a day to clear out sinuses Or Normal saline nasal spray Flonase steroid nasal spray Antihistamine daily such as cetirizine in the morning tylenol Lots of fluids  Take tamiflu once a day for ten days for exposure to flu If you start feeling like you have the flu, take the tamiflu medicine that you have left twice a day

## 2017-01-14 NOTE — Progress Notes (Signed)
  Subjective:   Patient ID: Lauren Gardner, female    DOB: 12-22-1960, 56 y.o.   MRN: [id number] CC: Cough; Nasal Congestion; and Fatigue  HPI: Lauren Gardner is a 56 y.o. female presenting for Cough; Nasal Congestion; and Fatigue  R ear has been hurting Started getting sick yesterday with congestion, fatigue No fevers Minimal dry cough Some loose stool for past few days Appetite has been down Has been around family members who tested positive for flu  Relevant past medical, surgical, family and social history reviewed. Allergies and medications reviewed and updated. History  Smoking Status  . Current Every Day Smoker  . Packs/day: 0.25  . Years: 4.00  . Types: Cigarettes  Smokeless Tobacco  . Never Used   ROS: Per HPI   Objective:    BP 139/82   Pulse 96   Temp 98.7 F (37.1 C) (Oral)   Ht 5\' 8"  (1.727 m)   Wt 294 lb 3.2 oz (133.4 kg)   BMI 44.73 kg/m   Wt Readings from Last 3 Encounters:  01/14/17 294 lb 3.2 oz (133.4 kg)  10/07/16 298 lb (135.2 kg)  12/16/15 (!) 308 lb (139.7 kg)    Gen: NAD, alert, cooperative with exam, NCAT EYES: EOMI, no conjunctival injection, or no icterus ENT:  TMs dull gray b/l, OP without erythema LYMPH: no cervical LAD CV: NRRR, normal S1/S2, no murmur, distal pulses 2+ b/l Resp: CTABL, no wheezes, normal WOB Abd: +BS, soft, NTND. no guarding or organomegaly Ext: No edema, warm Neuro: Alert and oriented MSK: normal muscle bulk  Assessment & Plan:  Corlyn was seen today for cough, nasal congestion and fatigue.  Diagnoses and all orders for this visit:  Acute URI Discussed symptom care, return precautions Given exposure to flu, will treat with ppx, if develops fevers start taking twice a day as treatment dosing  Exposure to the flu -     oseltamivir (TAMIFLU) 75 MG capsule; Take 1 capsule (75 mg total) by mouth daily.   Follow up plan: prn Assunta Found, MD [place]

## 2017-01-20 ENCOUNTER — Telehealth: Payer: Self-pay | Admitting: Nurse Practitioner

## 2017-01-20 NOTE — Telephone Encounter (Signed)
Spoke with patient advised of recommendation. Patient was not happy. Advised patient to try some Delysm over the counter. If no better to please give [place] a call back.

## 2017-01-20 NOTE — Telephone Encounter (Signed)
Has to be seen to get rx cough meds

## 2017-01-20 NOTE — Telephone Encounter (Signed)
Please advise 

## 2017-01-24 ENCOUNTER — Telehealth: Payer: Self-pay | Admitting: Nurse Practitioner

## 2017-01-31 ENCOUNTER — Telehealth: Payer: Self-pay | Admitting: Nurse Practitioner

## 2017-02-02 NOTE — Telephone Encounter (Signed)
Called patient and told her paperwork was ready to pickup.

## 2017-02-04 NOTE — Telephone Encounter (Signed)
Phone call taken care of in different encounter.

## 2017-02-21 DIAGNOSIS — E785 Hyperlipidemia, unspecified: Secondary | ICD-10-CM | POA: Insufficient documentation

## 2017-03-07 DIAGNOSIS — I8392 Asymptomatic varicose veins of left lower extremity: Secondary | ICD-10-CM | POA: Insufficient documentation

## 2017-03-07 DIAGNOSIS — G8929 Other chronic pain: Secondary | ICD-10-CM | POA: Insufficient documentation

## 2017-03-21 ENCOUNTER — Other Ambulatory Visit (HOSPITAL_COMMUNITY): Payer: Self-pay | Admitting: Adult Health Nurse Practitioner

## 2017-03-21 ENCOUNTER — Ambulatory Visit (HOSPITAL_COMMUNITY)
Admission: RE | Admit: 2017-03-21 | Discharge: 2017-03-21 | Disposition: A | Discharge status: Home / Self Care | Payer: BLUE CROSS/BLUE SHIELD | Source: Ambulatory Visit | Attending: Adult Health Nurse Practitioner | Admitting: Adult Health Nurse Practitioner

## 2017-03-21 DIAGNOSIS — M5442 Lumbago with sciatica, left side: Secondary | ICD-10-CM

## 2017-03-21 DIAGNOSIS — G8929 Other chronic pain: Secondary | ICD-10-CM | POA: Diagnosis not present

## 2017-03-21 DIAGNOSIS — M5440 Lumbago with sciatica, unspecified side: Secondary | ICD-10-CM | POA: Diagnosis present

## 2017-03-22 ENCOUNTER — Ambulatory Visit: Payer: BLUE CROSS/BLUE SHIELD | Admitting: Nurse Practitioner

## 2017-04-04 DIAGNOSIS — M5136 Other intervertebral disc degeneration, lumbar region: Secondary | ICD-10-CM | POA: Insufficient documentation

## 2017-04-27 ENCOUNTER — Other Ambulatory Visit: Payer: Self-pay | Admitting: Nurse Practitioner

## 2017-04-27 DIAGNOSIS — E782 Mixed hyperlipidemia: Secondary | ICD-10-CM

## 2017-04-27 DIAGNOSIS — I10 Essential (primary) hypertension: Secondary | ICD-10-CM

## 2017-06-28 DIAGNOSIS — E559 Vitamin D deficiency, unspecified: Secondary | ICD-10-CM | POA: Insufficient documentation

## 2017-08-10 ENCOUNTER — Other Ambulatory Visit (HOSPITAL_COMMUNITY): Payer: Self-pay | Admitting: Adult Health Nurse Practitioner

## 2017-08-10 DIAGNOSIS — Z1231 Encounter for screening mammogram for malignant neoplasm of breast: Secondary | ICD-10-CM

## 2017-08-29 ENCOUNTER — Other Ambulatory Visit: Payer: Self-pay | Admitting: Nurse Practitioner

## 2017-08-29 DIAGNOSIS — Z01419 Encounter for gynecological examination (general) (routine) without abnormal findings: Secondary | ICD-10-CM

## 2017-09-19 ENCOUNTER — Ambulatory Visit (HOSPITAL_COMMUNITY)
Admission: RE | Admit: 2017-09-19 | Discharge: 2017-09-19 | Disposition: A | Discharge status: Home / Self Care | Payer: BLUE CROSS/BLUE SHIELD | Source: Ambulatory Visit | Attending: Adult Health Nurse Practitioner | Admitting: Adult Health Nurse Practitioner

## 2017-09-19 ENCOUNTER — Ambulatory Visit (HOSPITAL_COMMUNITY): Payer: BLUE CROSS/BLUE SHIELD

## 2017-09-19 DIAGNOSIS — Z1231 Encounter for screening mammogram for malignant neoplasm of breast: Secondary | ICD-10-CM | POA: Insufficient documentation

## 2017-09-24 ENCOUNTER — Other Ambulatory Visit: Payer: Self-pay | Admitting: Nurse Practitioner

## 2017-09-24 DIAGNOSIS — Z01419 Encounter for gynecological examination (general) (routine) without abnormal findings: Secondary | ICD-10-CM

## 2017-11-28 ENCOUNTER — Emergency Department (HOSPITAL_COMMUNITY)
Admission: EM | Admit: 2017-11-28 | Discharge: 2017-11-28 | Disposition: A | Discharge status: Home / Self Care | Payer: Self-pay | Attending: Emergency Medicine | Admitting: Emergency Medicine

## 2017-11-28 ENCOUNTER — Encounter (HOSPITAL_COMMUNITY): Payer: Self-pay | Admitting: Cardiology

## 2017-11-28 DIAGNOSIS — X500XXA Overexertion from strenuous movement or load, initial encounter: Secondary | ICD-10-CM | POA: Insufficient documentation

## 2017-11-28 DIAGNOSIS — F17210 Nicotine dependence, cigarettes, uncomplicated: Secondary | ICD-10-CM | POA: Insufficient documentation

## 2017-11-28 DIAGNOSIS — Y990 Civilian activity done for income or pay: Secondary | ICD-10-CM | POA: Insufficient documentation

## 2017-11-28 DIAGNOSIS — J45909 Unspecified asthma, uncomplicated: Secondary | ICD-10-CM | POA: Insufficient documentation

## 2017-11-28 DIAGNOSIS — S76912A Strain of unspecified muscles, fascia and tendons at thigh level, left thigh, initial encounter: Secondary | ICD-10-CM | POA: Insufficient documentation

## 2017-11-28 DIAGNOSIS — Y929 Unspecified place or not applicable: Secondary | ICD-10-CM | POA: Insufficient documentation

## 2017-11-28 DIAGNOSIS — T148XXA Other injury of unspecified body region, initial encounter: Secondary | ICD-10-CM

## 2017-11-28 DIAGNOSIS — I10 Essential (primary) hypertension: Secondary | ICD-10-CM | POA: Insufficient documentation

## 2017-11-28 DIAGNOSIS — S76911A Strain of unspecified muscles, fascia and tendons at thigh level, right thigh, initial encounter: Secondary | ICD-10-CM | POA: Insufficient documentation

## 2017-11-28 DIAGNOSIS — Y9389 Activity, other specified: Secondary | ICD-10-CM | POA: Insufficient documentation

## 2017-11-28 DIAGNOSIS — Z79899 Other long term (current) drug therapy: Secondary | ICD-10-CM | POA: Insufficient documentation

## 2017-11-28 LAB — URINALYSIS, ROUTINE W REFLEX MICROSCOPIC
Bilirubin Urine: NEGATIVE
Glucose, UA: NEGATIVE mg/dL
KETONES UR: NEGATIVE mg/dL
Leukocytes, UA: NEGATIVE
Nitrite: NEGATIVE
PH: 6 (ref 5.0–8.0)
PROTEIN: NEGATIVE mg/dL
Specific Gravity, Urine: 1.01 (ref 1.005–1.030)

## 2017-11-28 MED ORDER — CYCLOBENZAPRINE HCL 10 MG PO TABS: 10.0000 mg | ORAL_TABLET | Freq: Three times a day (TID) | ORAL | 0 refills | Status: DC

## 2017-11-28 MED ORDER — TRAMADOL HCL 50 MG PO TABS
100.0000 mg | ORAL_TABLET | Freq: Once | ORAL | Status: AC
  Administered 2017-11-28: 100 mg via ORAL
  Filled 2017-11-28: qty 2

## 2017-11-28 MED ORDER — ONDANSETRON HCL 4 MG PO TABS
4.0000 mg | ORAL_TABLET | Freq: Once | ORAL | Status: AC
Start: 2017-11-28 — End: 2017-11-28
  Administered 2017-11-28: 4 mg via ORAL
  Filled 2017-11-28: qty 1

## 2017-11-28 MED ORDER — TRAMADOL HCL 50 MG PO TABS: 50.0000 mg | ORAL_TABLET | Freq: Four times a day (QID) | ORAL | 0 refills | Status: DC | PRN

## 2017-11-28 MED ORDER — IBUPROFEN 400 MG PO TABS: 400.0000 mg | ORAL_TABLET | Freq: Four times a day (QID) | ORAL | 0 refills | Status: DC

## 2017-11-28 MED ORDER — IBUPROFEN 400 MG PO TABS
400.0000 mg | ORAL_TABLET | Freq: Once | ORAL | Status: AC
  Administered 2017-11-28: 400 mg via ORAL
  Filled 2017-11-28: qty 1

## 2017-11-28 MED ORDER — CYCLOBENZAPRINE HCL 10 MG PO TABS
10.0000 mg | ORAL_TABLET | Freq: Once | ORAL | Status: AC
  Administered 2017-11-28: 10 mg via ORAL
  Filled 2017-11-28: qty 1

## 2017-11-28 NOTE — ED Provider Notes (Signed)
[facility] EMERGENCY DEPARTMENT Provider Note   CSN: [id number] Arrival date & time: 11/28/17  1411     History   Chief Complaint Chief Complaint  Patient presents with  . Leg Pain    HPI Lauren Gardner is a 56 y.o. female.  Patient is a 56 year old female who presents to the emergency department with bilateral upper leg pain.  The patient states that she has been working a new job over the last week or so.  Her job involves lifting, pushing, and bending from time to time.  The patient states that she has had some mild all muscle related pain, but for the last 2-3 days she has been having increasing pain in the right and left upper leg extending into the inner thigh area.  She states that she has been told her she had some back problems in the past, but she states her back really does not bother her too much but she does have pain in her upper thigh areas.  She has not had any loss of bowel or bladder function.  She has not had excessive falls.  She states the pain is worse when she is standing or walking.  She gets mild relief when she is at rest but this does not completely resolve the issue.  She has not had any loss of sensation in the  saddle area.   The history is provided by the patient.    Past Medical History:  Diagnosis Date  . Asthma   . Hyperlipidemia   . Hypertension     Patient Active Problem List   Diagnosis Date Noted  . Essential hypertension 09/24/2014  . Hyperlipidemia 09/24/2014  . Severe obesity (BMI >= 40) ([place]) 07/07/2013  . Seasonal allergic rhinitis 07/07/2013  . Seasonal allergic conjunctivitis 07/07/2013    Past Surgical History:  Procedure Laterality Date  . COLONOSCOPY N/A 06/18/2015   Procedure: COLONOSCOPY;  Surgeon: Rogene Houston, MD;  Location: [facility];  Service: Endoscopy;  Laterality: N/A;  930  . NO PAST SURGERIES      OB History    No data available       Home Medications    Prior to Admission medications     Medication Sig Start Date End Date Taking? Authorizing Provider  CAMILA 0.35 MG tablet TAKE 1 TABLET (0.35 MG TOTAL) BY MOUTH DAILY. 08/29/17   Hassell Done, Mary-Margaret, FNP  fluticasone (FLONASE) 50 MCG/ACT nasal spray Place 1 spray into both nostrils 2 (two) times daily as needed for allergies or rhinitis. 10/07/16   Hassell Done, Mary-Margaret, FNP  hydrochlorothiazide (HYDRODIURIL) 25 MG tablet TAKE 1 TABLET BY MOUTH EVERY DAY 10/07/16   Hassell Done, Mary-Margaret, FNP  montelukast (SINGULAIR) 10 MG tablet Take 1 tablet (10 mg total) by mouth at bedtime. Reported on 12/16/2015 12/16/15   Chipper Herb, MD  oseltamivir (TAMIFLU) 75 MG capsule Take 1 capsule (75 mg total) by mouth daily. 01/14/17   Eustaquio Maize, MD  pravastatin (PRAVACHOL) 40 MG tablet Take 1 tablet (40 mg total) by mouth daily. 10/07/16   Chevis Pretty, FNP  Vitamin D, Ergocalciferol, (DRISDOL) 50000 units CAPS capsule TAKE ONE CAPSULE BY MOUTH EVERY SEVEN DAYS 06/10/16   Wardell Honour, MD    Family History Family History  Problem Relation Age of Onset  . Hyperlipidemia Mother   . Hypertension Mother   . Cancer Sister   . Diabetes Sister   . Hyperlipidemia Sister   . Hypertension Sister   . Colon cancer  Other     Social History Social History   Tobacco Use  . Smoking status: Current Every Day Smoker    Packs/day: 0.25    Years: 4.00    Pack years: 1.00    Types: Cigarettes  . Smokeless tobacco: Never Used  Substance Use Topics  . Alcohol use: No    Alcohol/week: 0.0 oz  . Drug use: No     Allergies   Amoxicillin and Prednisolone   Review of Systems Review of Systems  Constitutional: Negative for activity change and appetite change.       All ROS Neg except as noted in HPI  HENT: Negative for nosebleeds.   Eyes: Negative for photophobia and discharge.  Respiratory: Negative for cough, shortness of breath and wheezing.   Cardiovascular: Negative for chest pain and palpitations.   Gastrointestinal: Negative for abdominal pain and blood in stool.  Genitourinary: Negative for dysuria, frequency and hematuria.  Musculoskeletal: Positive for arthralgias. Negative for back pain and neck pain.  Skin: Negative.   Neurological: Negative for dizziness, seizures, speech difficulty, weakness and numbness.  Psychiatric/Behavioral: Negative for confusion and hallucinations.     Physical Exam Updated Vital Signs BP (!) 141/76 (BP Location: Right Arm)   Pulse 70   Temp 98 F (36.7 C) (Oral)   Resp (!) 22   Ht 5\' 9"  (1.753 m)   Wt 83.9 kg (185 lb)   SpO2 97%   BMI 27.32 kg/m   Physical Exam  Constitutional: She is oriented to person, place, and time. She appears well-developed and well-nourished.  Non-toxic appearance.  HENT:  Head: Normocephalic.  Right Ear: Tympanic membrane and external ear normal.  Left Ear: Tympanic membrane and external ear normal.  Eyes: EOM and lids are normal. Pupils are equal, round, and reactive to light.  Neck: Normal range of motion. Neck supple. Carotid bruit is not present.  Cardiovascular: Normal rate, regular rhythm, normal heart sounds, intact distal pulses and normal pulses.  Pulmonary/Chest: Breath sounds normal. No respiratory distress.  Abdominal: Soft. Bowel sounds are normal. There is no tenderness. There is no guarding.  Musculoskeletal: Normal range of motion.       Right upper leg: She exhibits tenderness.       Left upper leg: She exhibits tenderness.       Legs: Lymphadenopathy:       Head (right side): No submandibular adenopathy present.       Head (left side): No submandibular adenopathy present.    She has no cervical adenopathy.  Neurological: She is alert and oriented to person, place, and time. She has normal strength. No cranial nerve deficit or sensory deficit.  There are no motor or sensory deficits appreciated of the lower extremities.  There is no foot drop noted.  Patient walks with a slight limp, but gait  is steady.  No problems with balance.  Skin: Skin is warm and dry.  Psychiatric: She has a normal mood and affect. Her speech is normal.  Nursing note and vitals reviewed.    ED Treatments / Results  Labs (all labs ordered are listed, but only abnormal results are displayed) Labs Reviewed - No data to display  EKG  EKG Interpretation None       Radiology No results found.  Procedures Procedures (including critical care time)  Medications Ordered in ED Medications - No data to display   Initial Impression / Assessment and Plan / ED Course  I have reviewed the triage vital signs and  the nursing notes.  Pertinent labs & imaging results that were available during my care of the patient were reviewed by me and considered in my medical decision making (see chart for details).       Final Clinical Impressions(s) / ED Diagnoses MDM Vital signs reviewed.  Pulse oximetry is 97% on room air.  Within normal limits by my interpretation.  No gross neurologic deficits appreciated on examination.  Patient is a question as to whether or not she may be developing a urinary tract infection.  Urine analysis is negative for acute urinary tract infection at this time.  There is no evidence of dehydration.  I suspect that the patient has a muscle strain.  She later states that during of couple of her shift she was wearing shoes that were ill fitting, and forced her to walk somewhat differently than she usually walks during the course of 1 of her shifts.  The patient will be treated with rest, warm tub soaks, Flexeril, Motrin, and 12 tablets of Ultram for more severe pain.  The patient is to follow-up with her primary physician, or return to the emergency department if any emergent changes, problems, or concerns.  Patient is in agreement with this plan.   Final diagnoses:  Muscle strain    ED Discharge Orders        Ordered    cyclobenzaprine (FLEXERIL) 10 MG tablet  3 times daily      11/28/17 1704    ibuprofen (ADVIL,MOTRIN) 400 MG tablet  4 times daily     11/28/17 1704    traMADol (ULTRAM) 50 MG tablet  Every 6 hours PRN     11/28/17 1704       Lily Kocher, PA-C 11/28/17 1946    Ezequiel Essex, MD 11/28/17 2229

## 2017-11-28 NOTE — ED Triage Notes (Signed)
Bilateral upper  leg pain since Saturday.  Pt states she started a new job and pain started after her shift Saturday.

## 2017-11-28 NOTE — Discharge Instructions (Signed)
Your blood pressure is slightly elevated.  Please have this rechecked soon.  Your Urine test is negative.  Your examination suggests muscle strain involving your legs.  Please use appropriate size foot wear.  Please rest your muscles as much as possible.  Use ibuprofen and Flexeril as prescribed.  May use Ultram for more severe pain.  Ultram and Flexeril may cause drowsiness.  Please do not drive a vehicle, operate machinery, drink alcohol, or participate in activities requiring concentration when taking this medication.  Please see your primary physician for additional evaluation if not improving.  Return to the emergency department if any emergent changes or problems.

## 2018-02-16 DIAGNOSIS — Z124 Encounter for screening for malignant neoplasm of cervix: Secondary | ICD-10-CM | POA: Insufficient documentation

## 2018-02-16 DIAGNOSIS — Z01419 Encounter for gynecological examination (general) (routine) without abnormal findings: Secondary | ICD-10-CM | POA: Insufficient documentation

## 2018-04-21 ENCOUNTER — Other Ambulatory Visit (HOSPITAL_COMMUNITY): Payer: Self-pay | Admitting: Family Medicine

## 2018-04-21 ENCOUNTER — Ambulatory Visit (HOSPITAL_COMMUNITY)
Admission: RE | Admit: 2018-04-21 | Discharge: 2018-04-21 | Disposition: A | Discharge status: Home / Self Care | Payer: 59 | Source: Ambulatory Visit | Attending: Family Medicine | Admitting: Family Medicine

## 2018-04-21 DIAGNOSIS — R52 Pain, unspecified: Secondary | ICD-10-CM

## 2018-04-21 DIAGNOSIS — M545 Low back pain: Secondary | ICD-10-CM | POA: Insufficient documentation

## 2018-04-21 DIAGNOSIS — M47816 Spondylosis without myelopathy or radiculopathy, lumbar region: Secondary | ICD-10-CM | POA: Insufficient documentation

## 2018-08-22 DIAGNOSIS — J302 Other seasonal allergic rhinitis: Secondary | ICD-10-CM | POA: Insufficient documentation

## 2018-09-01 ENCOUNTER — Other Ambulatory Visit (HOSPITAL_COMMUNITY): Payer: Self-pay | Admitting: Adult Health Nurse Practitioner

## 2018-09-01 DIAGNOSIS — Z1231 Encounter for screening mammogram for malignant neoplasm of breast: Secondary | ICD-10-CM

## 2018-09-21 ENCOUNTER — Ambulatory Visit (HOSPITAL_COMMUNITY)
Admission: RE | Admit: 2018-09-21 | Discharge: 2018-09-21 | Disposition: A | Discharge status: Home / Self Care | Payer: 59 | Source: Ambulatory Visit | Attending: Adult Health Nurse Practitioner | Admitting: Adult Health Nurse Practitioner

## 2018-09-21 DIAGNOSIS — Z1231 Encounter for screening mammogram for malignant neoplasm of breast: Secondary | ICD-10-CM | POA: Insufficient documentation

## 2019-07-17 DIAGNOSIS — M5440 Lumbago with sciatica, unspecified side: Secondary | ICD-10-CM | POA: Insufficient documentation

## 2019-08-13 ENCOUNTER — Other Ambulatory Visit (HOSPITAL_COMMUNITY): Payer: Self-pay | Admitting: Adult Health Nurse Practitioner

## 2019-08-13 DIAGNOSIS — Z1231 Encounter for screening mammogram for malignant neoplasm of breast: Secondary | ICD-10-CM

## 2019-09-27 ENCOUNTER — Ambulatory Visit (HOSPITAL_COMMUNITY)
Admission: RE | Admit: 2019-09-27 | Discharge: 2019-09-27 | Disposition: A | Discharge status: Home / Self Care | Payer: 59 | Source: Ambulatory Visit | Attending: Adult Health Nurse Practitioner | Admitting: Adult Health Nurse Practitioner

## 2019-09-27 ENCOUNTER — Other Ambulatory Visit: Payer: Self-pay

## 2019-09-27 DIAGNOSIS — Z1231 Encounter for screening mammogram for malignant neoplasm of breast: Secondary | ICD-10-CM | POA: Diagnosis present

## 2020-09-23 ENCOUNTER — Other Ambulatory Visit (HOSPITAL_COMMUNITY): Payer: Self-pay | Admitting: Adult Health Nurse Practitioner

## 2020-09-23 DIAGNOSIS — Z1231 Encounter for screening mammogram for malignant neoplasm of breast: Secondary | ICD-10-CM

## 2020-10-01 ENCOUNTER — Ambulatory Visit (HOSPITAL_COMMUNITY)
Admission: RE | Admit: 2020-10-01 | Discharge: 2020-10-01 | Disposition: A | Discharge status: Home / Self Care | Payer: 59 | Source: Ambulatory Visit | Attending: Adult Health Nurse Practitioner | Admitting: Adult Health Nurse Practitioner

## 2020-10-01 ENCOUNTER — Other Ambulatory Visit: Payer: Self-pay

## 2020-10-01 DIAGNOSIS — Z1231 Encounter for screening mammogram for malignant neoplasm of breast: Secondary | ICD-10-CM | POA: Insufficient documentation

## 2021-09-21 ENCOUNTER — Other Ambulatory Visit (HOSPITAL_COMMUNITY): Payer: Self-pay | Admitting: Adult Health Nurse Practitioner

## 2021-09-21 DIAGNOSIS — Z1231 Encounter for screening mammogram for malignant neoplasm of breast: Secondary | ICD-10-CM

## 2021-10-05 ENCOUNTER — Other Ambulatory Visit: Payer: Self-pay

## 2021-10-05 ENCOUNTER — Ambulatory Visit (HOSPITAL_COMMUNITY)
Admission: RE | Admit: 2021-10-05 | Discharge: 2021-10-05 | Disposition: A | Discharge status: Home / Self Care | Payer: 59 | Source: Ambulatory Visit | Attending: Adult Health Nurse Practitioner | Admitting: Adult Health Nurse Practitioner

## 2021-10-05 DIAGNOSIS — Z1231 Encounter for screening mammogram for malignant neoplasm of breast: Secondary | ICD-10-CM | POA: Diagnosis not present

## 2022-09-14 ENCOUNTER — Other Ambulatory Visit (HOSPITAL_COMMUNITY): Payer: Self-pay | Admitting: Adult Health Nurse Practitioner

## 2022-09-14 DIAGNOSIS — Z1231 Encounter for screening mammogram for malignant neoplasm of breast: Secondary | ICD-10-CM

## 2022-10-11 ENCOUNTER — Ambulatory Visit (HOSPITAL_COMMUNITY)
Admission: RE | Admit: 2022-10-11 | Discharge: 2022-10-11 | Disposition: A | Discharge status: Home / Self Care | Payer: 59 | Source: Ambulatory Visit | Attending: Adult Health Nurse Practitioner | Admitting: Adult Health Nurse Practitioner

## 2022-10-11 DIAGNOSIS — Z1231 Encounter for screening mammogram for malignant neoplasm of breast: Secondary | ICD-10-CM | POA: Insufficient documentation

## 2022-12-08 DIAGNOSIS — J4520 Mild intermittent asthma, uncomplicated: Secondary | ICD-10-CM | POA: Insufficient documentation

## 2023-01-23 IMAGING — MG MM DIGITAL SCREENING BILAT W/ TOMO AND CAD
8 of 14 series · 8 of 40 positions shown · non-contrast
Comparison: Previous exam(s).

ACR Breast Density Category a: The breast tissue is almost entirely
fatty.

CLINICAL DATA: Screening.

EXAM:
DIGITAL SCREENING BILATERAL MAMMOGRAM WITH TOMOSYNTHESIS AND CAD
TECHNIQUE: Bilateral screening digital craniocaudal and mediolateral oblique
mammograms were obtained. Bilateral screening digital breast
tomosynthesis was performed. The images were evaluated with
computer-aided detection.

[L CC synth-2D (1 of 2)]
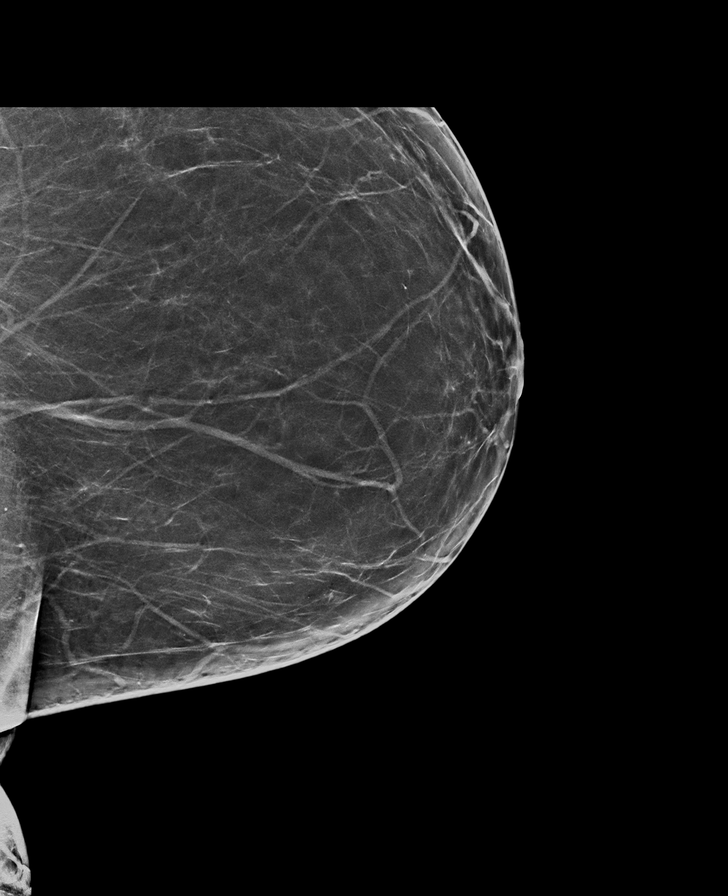

[L MLO synth-2D (1 of 2)]
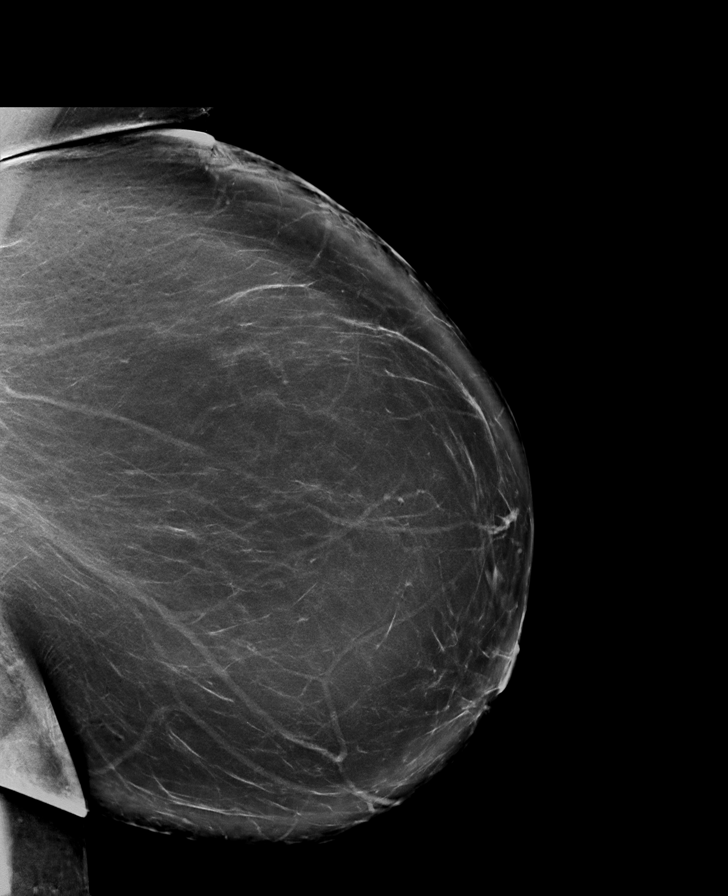

[R MLO synth-2D (1 of 2)]
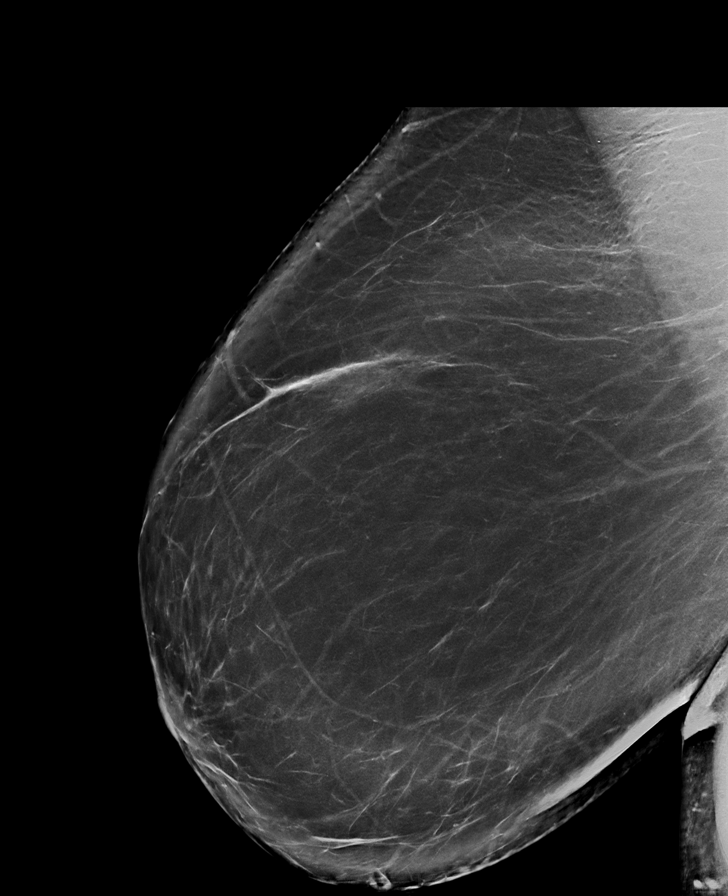

[R MLO synth-2D (2 of 2)]
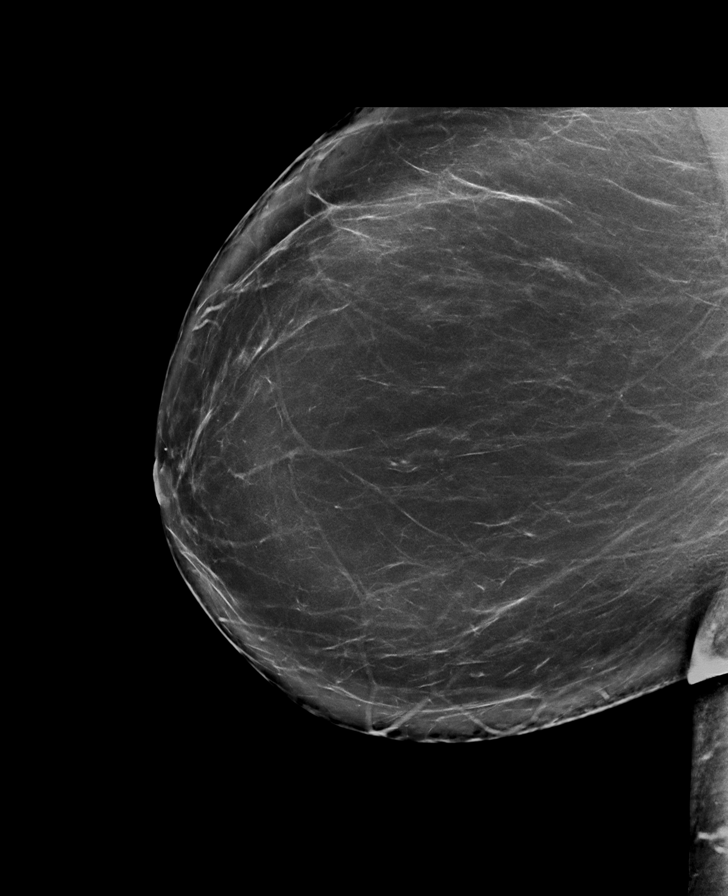

[R CC synth-2D]
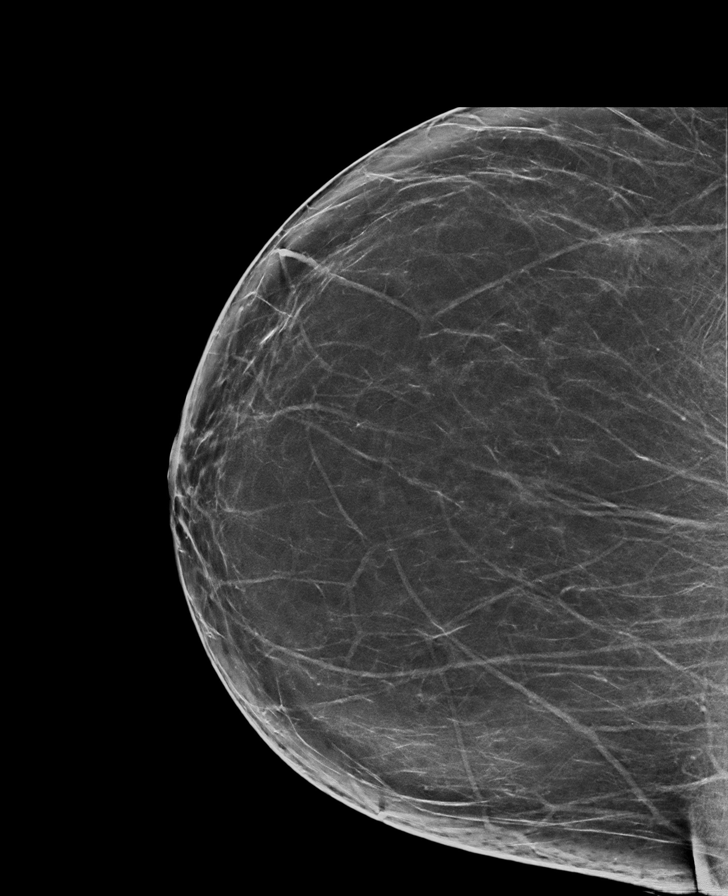

[L CC synth-2D (2 of 2)]
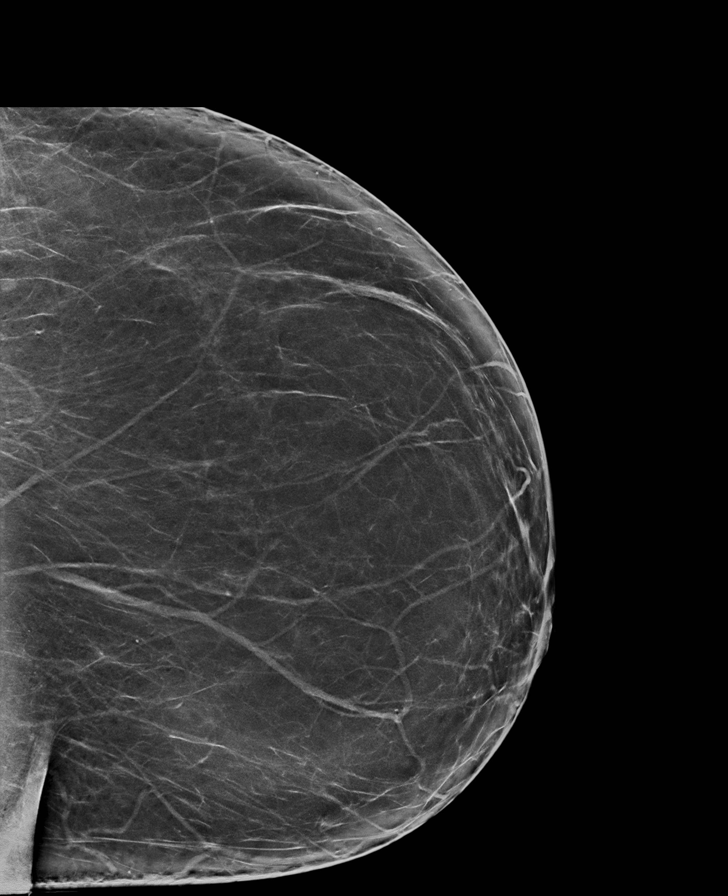

[L MLO synth-2D (2 of 2)]
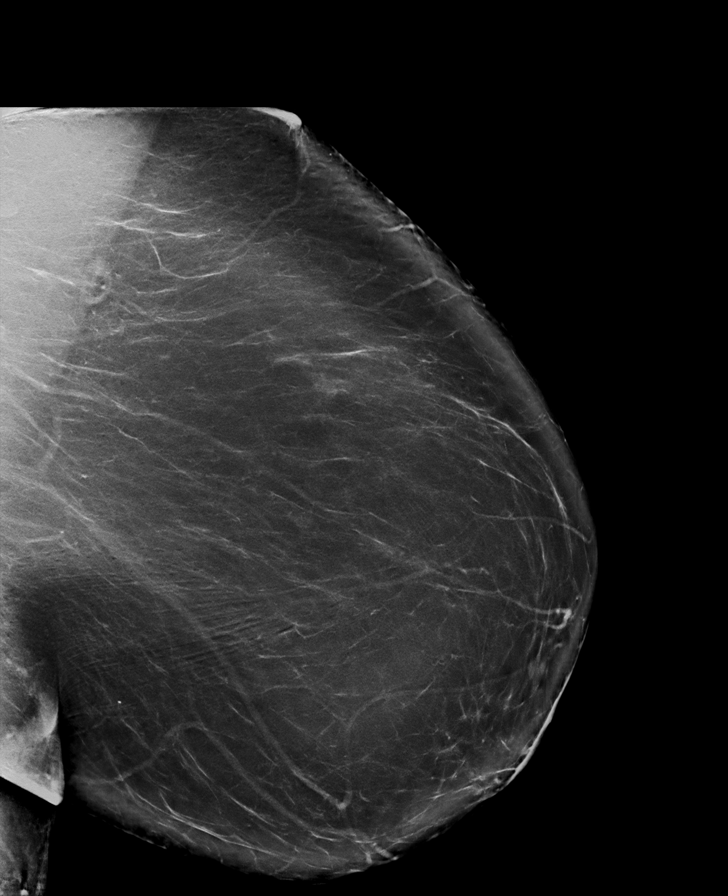

[R MLO tomo · tomo slice 55/108.0]
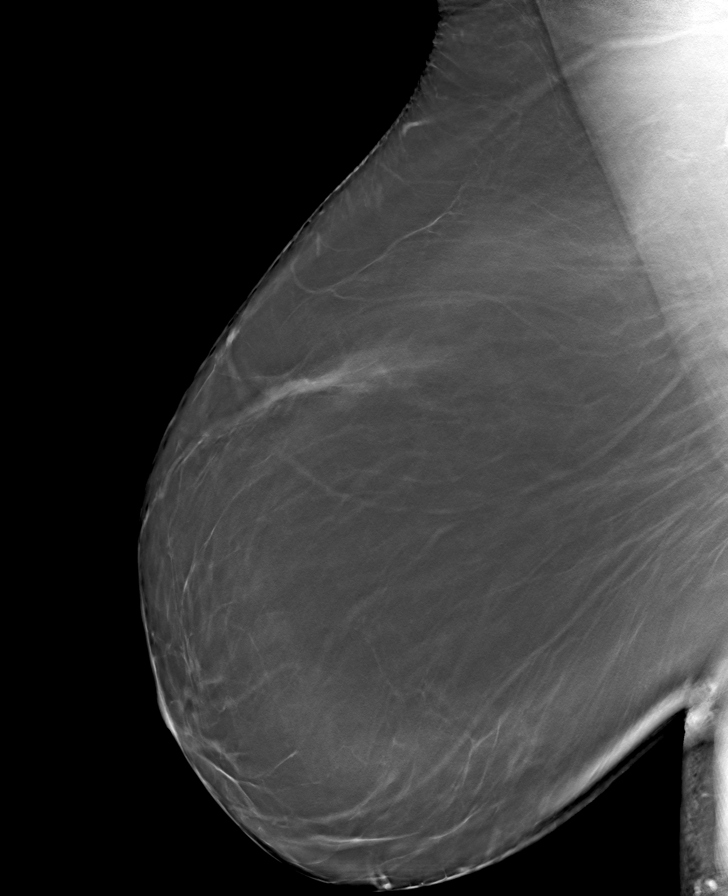

[8 of 40 positions shown; findings below may reference images not displayed]

FINDINGS: There are no findings suspicious for malignancy.
IMPRESSION: No mammographic evidence of malignancy. A result letter of this
screening mammogram will be mailed directly to the patient.

RECOMMENDATION:
Screening mammogram in one year. (Code:0E-3-N98)

BI-RADS CATEGORY  1: Negative.

## 2023-04-28 ENCOUNTER — Ambulatory Visit: Payer: 59 | Admitting: Orthopedic Surgery

## 2023-05-12 ENCOUNTER — Encounter: Payer: Self-pay | Admitting: Orthopedic Surgery

## 2023-05-12 ENCOUNTER — Ambulatory Visit (INDEPENDENT_AMBULATORY_CARE_PROVIDER_SITE_OTHER): Payer: 59 | Admitting: Orthopedic Surgery

## 2023-05-12 VITALS — BP 169/93 | HR 82 | Ht 69.0 in | Wt 285.0 lb

## 2023-05-12 DIAGNOSIS — M7121 Synovial cyst of popliteal space [Baker], right knee: Secondary | ICD-10-CM

## 2023-05-12 DIAGNOSIS — M25461 Effusion, right knee: Secondary | ICD-10-CM

## 2023-05-12 DIAGNOSIS — M1711 Unilateral primary osteoarthritis, right knee: Secondary | ICD-10-CM | POA: Diagnosis not present

## 2023-05-12 NOTE — Progress Notes (Signed)
   Chief Complaint  Patient presents with   Knee Pain    Right medial knee painful for 3 weeks     HPI: 62 year old female who is still employed and does a lot of walking presents with pain in the right knee and joint effusion and Baker's cyst.  The patient was seen at [facility] had an ultrasound which showed a Baker's cyst with possible rupture but no DVT she also had imaging done on her back which was read as degenerative disc disease L4-5 L5 1 facet arthritis L2-3 L3-4 L4-5 L5-S1 anterolisthesis of L4 on 5 as well  I read the knee x-ray myself and it was notable for osteoarthritis of the joint  Pictures are in the media section of the chart  Patient says her symptoms came on gradually despite being on diclofenac for arthritis in her foot  No trauma  She notes that she played softball for 29 years  Past Medical History:  Diagnosis Date   Asthma    Hyperlipidemia    Hypertension     BP (!) 169/93   Pulse 82   Ht 5\' 9"  (1.753 m)   Wt 285 lb (129.3 kg)   BMI 42.09 kg/m    General appearance: Well-developed well-nourished no gross deformities  Cardiovascular normal pulse and perfusion normal color without edema  Neurologically no sensation loss or deficits or pathologic reflexes  Psychological: Awake alert and oriented x3 mood and affect normal  Skin no lacerations or ulcerations no nodularity no palpable masses, no erythema or nodularity  Musculoskeletal:   Right knee and left knee right knee is swollen compared to the left Right knee the skin is normal Tenderness medial joint line Joint effusion and moderate Instability none Muscle tone normal   Imaging OA right knee  A/P  Encounter Diagnoses  Name Primary?   Baker's cyst of knee, right Yes   Primary osteoarthritis of right knee    Effusion of right knee     Recommend aspiration injection continue diclofenac follow-up in 6 weeks  Procedure note right knee injection   verbal consent was obtained  to inject right knee joint  Timeout was completed to confirm the site of injection  The medications used were depomedrol 40 mg and 1% lidocaine 3 cc Anesthesia was provided by ethyl chloride and the skin was prepped with alcohol.  After cleaning the skin with alcohol a 20-gauge needle was used to inject the right knee joint. There were no complications. A sterile bandage was applied.

## 2023-05-17 ENCOUNTER — Other Ambulatory Visit: Payer: Self-pay | Admitting: Orthopedic Surgery

## 2023-05-17 MED ORDER — DICLOFENAC SODIUM 75 MG PO TBEC: 75.0000 mg | DELAYED_RELEASE_TABLET | Freq: Two times a day (BID) | ORAL | 2 refills | Status: DC

## 2023-05-17 NOTE — Telephone Encounter (Signed)
Dr. Mort Sawyers pt - pt left a message stating she was confused on medications that Dr. Romeo Apple prescribed.  She is requesting clarification on how to take.  [phone]

## 2023-05-17 NOTE — Telephone Encounter (Signed)
continue diclofenac / he didn't send anything additional I called her to advise. Left message for her to call me back

## 2023-05-17 NOTE — Addendum Note (Signed)
Addended byCaffie Damme on: 05/17/2023 04:04 PM   Modules accepted: Orders

## 2023-05-17 NOTE — Telephone Encounter (Signed)
I called her she doesn't have much left of the diclofenac wants sent in to [facility]

## 2023-06-27 ENCOUNTER — Ambulatory Visit: Payer: 59 | Admitting: Orthopedic Surgery

## 2023-07-14 ENCOUNTER — Ambulatory Visit: Payer: 59 | Admitting: Orthopedic Surgery

## 2023-07-14 ENCOUNTER — Ambulatory Visit (INDEPENDENT_AMBULATORY_CARE_PROVIDER_SITE_OTHER): Payer: 59 | Admitting: Orthopedic Surgery

## 2023-07-14 DIAGNOSIS — M25461 Effusion, right knee: Secondary | ICD-10-CM

## 2023-07-14 DIAGNOSIS — M1711 Unilateral primary osteoarthritis, right knee: Secondary | ICD-10-CM

## 2023-07-14 DIAGNOSIS — M7121 Synovial cyst of popliteal space [Baker], right knee: Secondary | ICD-10-CM | POA: Diagnosis not present

## 2023-07-14 NOTE — Progress Notes (Signed)
Chief Complaint  Patient presents with   Follow-up    Recheck on right knee.   Encounter Diagnoses  Name Primary?   Baker's cyst of knee, right Yes   Primary osteoarthritis of right knee    Effusion of right knee     62 year old female evaluated for knee pain.  Her knee x-ray showed some arthritis of the joint it was not significant her lumbar spine films show severe degenerative disc disease she was in for knee pain right knee about 3 to 4 weeks ago with severe pain.  She had an x-ray as noted she had an injection she was already on diclofenac for her foot  After the injection she noted decreased pain and increased motion decreased swelling and improved function  She can bend and straighten the knee well today she has excellent quadriceps control she is walking without a limp she does not need to see [place] until the knee bothers her again

## 2023-09-13 ENCOUNTER — Other Ambulatory Visit (HOSPITAL_COMMUNITY): Payer: Self-pay | Admitting: Adult Health Nurse Practitioner

## 2023-09-13 DIAGNOSIS — Z1231 Encounter for screening mammogram for malignant neoplasm of breast: Secondary | ICD-10-CM

## 2023-10-19 ENCOUNTER — Encounter (HOSPITAL_COMMUNITY): Payer: Self-pay

## 2023-10-19 ENCOUNTER — Ambulatory Visit (HOSPITAL_COMMUNITY)
Admission: RE | Admit: 2023-10-19 | Discharge: 2023-10-19 | Disposition: A | Discharge status: Home / Self Care | Payer: 59 | Source: Ambulatory Visit | Attending: Adult Health Nurse Practitioner | Admitting: Adult Health Nurse Practitioner

## 2023-10-19 DIAGNOSIS — Z1231 Encounter for screening mammogram for malignant neoplasm of breast: Secondary | ICD-10-CM | POA: Insufficient documentation

## 2023-12-27 ENCOUNTER — Other Ambulatory Visit: Payer: Self-pay | Admitting: Orthopedic Surgery

## 2024-03-16 ENCOUNTER — Other Ambulatory Visit: Payer: Self-pay | Admitting: Orthopedic Surgery

## 2024-05-12 NOTE — Progress Notes (Signed)
 Food resources given

## 2024-06-30 ENCOUNTER — Other Ambulatory Visit: Payer: Self-pay | Admitting: Orthopedic Surgery

## 2024-08-10 NOTE — Progress Notes (Signed)
 Pt attended 05/12/2024 screening event with BP of 148/98. Pt noted at event that she does have a PCP. At event pt did not indicate any SDOH needs. Pt also noted that she is not a smoker and listed Private as her insurance at the event.   Per initial f/u pt was mailed curtesy letter with BP management resources.  Per chart review pt does have a PCP (Comer Reid; [facility]), insurance, and is a former smoker. Pt's last appt with PCP was 12/01/2023 and has an upcoming appt on 12/17/2024. Pt does not indicate any SDOH needs at this time.  No additional pt f/u to be scheduled at this time per health equity protocol.

## 2024-09-27 ENCOUNTER — Other Ambulatory Visit (HOSPITAL_COMMUNITY): Payer: Self-pay | Admitting: Adult Health Nurse Practitioner

## 2024-09-27 DIAGNOSIS — Z1231 Encounter for screening mammogram for malignant neoplasm of breast: Secondary | ICD-10-CM

## 2024-09-29 ENCOUNTER — Other Ambulatory Visit: Payer: Self-pay | Admitting: Orthopedic Surgery

## 2024-10-22 ENCOUNTER — Ambulatory Visit (HOSPITAL_COMMUNITY)

## 2024-10-22 ENCOUNTER — Ambulatory Visit (HOSPITAL_COMMUNITY)
Admission: RE | Admit: 2024-10-22 | Discharge: 2024-10-22 | Disposition: A | Discharge status: Home / Self Care | Source: Ambulatory Visit | Attending: Adult Health Nurse Practitioner | Admitting: Adult Health Nurse Practitioner

## 2024-10-22 DIAGNOSIS — Z1231 Encounter for screening mammogram for malignant neoplasm of breast: Secondary | ICD-10-CM | POA: Diagnosis present
